# Patient Record
Sex: Female | Born: 1985 | Race: Black or African American | Hispanic: No | Marital: Single | Smoking: Never smoker
Health system: Southern US, Community
[De-identification: ages and names within clinical notes are randomized; demographics above are authoritative.]

## PROBLEM LIST (undated history)

## (undated) DIAGNOSIS — M329 Systemic lupus erythematosus, unspecified: Secondary | ICD-10-CM

## (undated) HISTORY — PX: LAPAROSCOPIC OVARIAN CYSTECTOMY: SUR786

---

## 2014-02-13 ENCOUNTER — Encounter (HOSPITAL_COMMUNITY): Payer: Self-pay | Admitting: Emergency Medicine

## 2014-02-13 ENCOUNTER — Emergency Department (HOSPITAL_COMMUNITY)
Admission: EM | Admit: 2014-02-13 | Discharge: 2014-02-13 | Disposition: A | Discharge status: Home / Self Care | Payer: BC Managed Care – PPO | Attending: Emergency Medicine | Admitting: Emergency Medicine

## 2014-02-13 DIAGNOSIS — Z3202 Encounter for pregnancy test, result negative: Secondary | ICD-10-CM | POA: Diagnosis not present

## 2014-02-13 DIAGNOSIS — R7989 Other specified abnormal findings of blood chemistry: Secondary | ICD-10-CM

## 2014-02-13 DIAGNOSIS — M329 Systemic lupus erythematosus, unspecified: Secondary | ICD-10-CM | POA: Diagnosis not present

## 2014-02-13 DIAGNOSIS — R809 Proteinuria, unspecified: Secondary | ICD-10-CM | POA: Insufficient documentation

## 2014-02-13 DIAGNOSIS — Z79899 Other long term (current) drug therapy: Secondary | ICD-10-CM | POA: Diagnosis not present

## 2014-02-13 HISTORY — DX: Systemic lupus erythematosus, unspecified: M32.9

## 2014-02-13 LAB — URINALYSIS, ROUTINE W REFLEX MICROSCOPIC
Bilirubin Urine: NEGATIVE
GLUCOSE, UA: NEGATIVE mg/dL
KETONES UR: NEGATIVE mg/dL
Leukocytes, UA: NEGATIVE
Nitrite: NEGATIVE
Protein, ur: 100 mg/dL — AB
Specific Gravity, Urine: 1.015 (ref 1.005–1.030)
Urobilinogen, UA: 0.2 mg/dL (ref 0.0–1.0)
pH: 5.5 (ref 5.0–8.0)

## 2014-02-13 LAB — URINE MICROSCOPIC-ADD ON

## 2014-02-13 LAB — CBC WITH DIFFERENTIAL/PLATELET
Basophils Absolute: 0 10*3/uL (ref 0.0–0.1)
Basophils Relative: 0 % (ref 0–1)
EOS PCT: 0 % (ref 0–5)
Eosinophils Absolute: 0 10*3/uL (ref 0.0–0.7)
HEMATOCRIT: 37 % (ref 36.0–46.0)
HEMOGLOBIN: 12.7 g/dL (ref 12.0–15.0)
LYMPHS ABS: 0.8 10*3/uL (ref 0.7–4.0)
LYMPHS PCT: 10 % — AB (ref 12–46)
MCH: 28.7 pg (ref 26.0–34.0)
MCHC: 34.3 g/dL (ref 30.0–36.0)
MCV: 83.7 fL (ref 78.0–100.0)
MONO ABS: 0.3 10*3/uL (ref 0.1–1.0)
MONOS PCT: 3 % (ref 3–12)
NEUTROS ABS: 7.3 10*3/uL (ref 1.7–7.7)
Neutrophils Relative %: 87 % — ABNORMAL HIGH (ref 43–77)
Platelets: 120 10*3/uL — ABNORMAL LOW (ref 150–400)
RBC: 4.42 MIL/uL (ref 3.87–5.11)
RDW: 12.3 % (ref 11.5–15.5)
WBC: 8.4 10*3/uL (ref 4.0–10.5)

## 2014-02-13 LAB — COMPREHENSIVE METABOLIC PANEL
ALT: 15 U/L (ref 0–35)
AST: 18 U/L (ref 0–37)
Albumin: 3.9 g/dL (ref 3.5–5.2)
Alkaline Phosphatase: 72 U/L (ref 39–117)
Anion gap: 15 (ref 5–15)
BILIRUBIN TOTAL: 0.5 mg/dL (ref 0.3–1.2)
BUN: 30 mg/dL — ABNORMAL HIGH (ref 6–23)
CALCIUM: 9.5 mg/dL (ref 8.4–10.5)
CHLORIDE: 104 meq/L (ref 96–112)
CO2: 19 mEq/L (ref 19–32)
CREATININE: 1.48 mg/dL — AB (ref 0.50–1.10)
GFR calc Af Amer: 55 mL/min — ABNORMAL LOW (ref >90)
GFR, EST NON AFRICAN AMERICAN: 47 mL/min — AB (ref >90)
GLUCOSE: 105 mg/dL — AB (ref 70–99)
Potassium: 4.6 mEq/L (ref 3.7–5.3)
Sodium: 138 mEq/L (ref 137–147)
Total Protein: 7.8 g/dL (ref 6.0–8.3)

## 2014-02-13 LAB — PREGNANCY, URINE: Preg Test, Ur: NEGATIVE

## 2014-02-13 MED ORDER — PREDNISONE 10 MG PO TABS: ORAL_TABLET | ORAL | Status: DC

## 2014-02-13 NOTE — ED Provider Notes (Signed)
Medical screening examination/treatment/procedure(s) were conducted as a shared visit with non-physician practitioner(s) and myself.  I personally evaluated the patient during the encounter.   EKG Interpretation None     I reviewed the patient's symptoms and her history present illness with a physical examination. At this point in time she appears to be having a mild exacerbation. She has no cardiopulmonary symptoms nor neurologic symptoms to suggest a complex presentation. At this point time she is well in appearance with stable vital signs and I do agree safe for discharge with a prednisone taper. She will be given resources to find a rheumatologist locally.  Arby Barrette, MD 02/13/14 [phone]

## 2014-02-13 NOTE — ED Notes (Addendum)
Pt has multiple complaints. Pt c/o lupus flare up, c/o pain in hips and knees, States she went jogging the other day and was fine. Pt states she is also fatigue and has scabs in scapl and on other parts of body. States he has been dizziness, constipated and having headaches.

## 2014-02-13 NOTE — ED Provider Notes (Signed)
CSN: [id number]     Arrival date & time 02/13/14  1438 History   First MD Initiated Contact with Patient 02/13/14 1558     Chief Complaint  Patient presents with  . Lupus   HPI  Patient is a 28 y.o. Female with PMH of lupus which is not currently being treated presents with multiple complaints.   Per the patient for the past 4 days she has had achiness and soreness in her bilateral hips, knees, and ankles.  She has also had a discoid rash break out on her face and scalp.  She has had intermittent headaches but is not currently having a headache.  She also feels like she has had some dizziness, weight gain, and constipation.  She denies chest pain shortness of breath, fever, chills, nausea, vomiting, diarrhea, melena, hematochezia.   All other ROS are negative at this time.  Per the patient this feels similar to previous lupus flairs.  The patient states that it has been two years since her last flair.  Patient has recently moved here and does not have a rheumatologist.  Patient has tried taking 20 mg prednisone for the past two days.    Past Medical History  Diagnosis Date  . Lupus    Past Surgical History  Procedure Laterality Date  . Laparoscopic ovarian cystectomy Left    No family history on file. History  Substance Use Topics  . Smoking status: Never Smoker   . Smokeless tobacco: Not on file  . Alcohol Use: Yes   OB History   Grav Para Term Preterm Abortions TAB SAB Ect Mult Living                 Review of Systems See HPI.  All other ROS are negative.   Allergies  Levaquin and Sulfa antibiotics  Home Medications   Prior to Admission medications   Medication Sig Start Date End Date Taking? Authorizing Provider  Multiple Vitamins-Minerals (HM MULTIVITAMIN ADULT GUMMY PO) Take 1 each by mouth daily.   Yes Historical Provider, MD  predniSONE (DELTASONE) 20 MG tablet Take 20 mg by mouth once.   Yes Historical Provider, MD  predniSONE (DELTASONE) 10 MG tablet Day 1 take 6  pills Day 2 take 5 pills Day 3 take 4 pills Day 4 take 3 pills Day 5 take 2 pills Day 6 take 1 pill 02/13/14   Suhani Stillion A Forcucci, PA-C   BP 135/70  Pulse 69  Temp(Src) 98.3 F (36.8 C) (Oral)  Resp 18  SpO2 100%  LMP 02/01/2014 Physical Exam  Nursing note and vitals reviewed. Constitutional: She is oriented to person, place, and time. She appears well-developed and well-nourished. No distress.  HENT:  Head: Normocephalic and atraumatic.  Mouth/Throat: Oropharynx is clear and moist. No oropharyngeal exudate.  Eyes: Conjunctivae and EOM are normal. Pupils are equal, round, and reactive to light. No scleral icterus.  Neck: Normal range of motion. Neck supple. No JVD present. No thyromegaly present.  Cardiovascular: Normal rate, regular rhythm, normal heart sounds and intact distal pulses.  Exam reveals no gallop and no friction rub.   No murmur heard. Pulmonary/Chest: Effort normal and breath sounds normal. No respiratory distress. She has no wheezes. She has no rales. She exhibits no tenderness.  Abdominal: Soft. Bowel sounds are normal. She exhibits no distension and no mass. There is no tenderness. There is no rebound and no guarding.  Musculoskeletal: Normal range of motion.       Right hip: Normal.  Left hip: Normal.       Right knee: Normal.       Left knee: Normal.       Right ankle: Normal.       Left ankle: Normal.  Lymphadenopathy:    She has no cervical adenopathy.  Neurological: She is alert and oriented to person, place, and time. No cranial nerve deficit. Coordination normal.  Skin: Skin is warm and dry. She is not diaphoretic.  Psychiatric: She has a normal mood and affect. Her behavior is normal. Judgment and thought content normal.    ED Course  Procedures (including critical care time) Labs Review Labs Reviewed  CBC WITH DIFFERENTIAL - Abnormal; Notable for the following:    Platelets 120 (*)    Neutrophils Relative % 87 (*)    Lymphocytes Relative  10 (*)    All other components within normal limits  COMPREHENSIVE METABOLIC PANEL - Abnormal; Notable for the following:    Glucose, Bld 105 (*)    BUN 30 (*)    Creatinine, Ser 1.48 (*)    GFR calc non Af Amer 47 (*)    GFR calc Af Amer 55 (*)    All other components within normal limits  URINALYSIS, ROUTINE W REFLEX MICROSCOPIC - Abnormal; Notable for the following:    Hgb urine dipstick TRACE (*)    Protein, ur 100 (*)    All other components within normal limits  PREGNANCY, URINE  URINE MICROSCOPIC-ADD ON    Imaging Review No results found.   EKG Interpretation None      MDM   Final diagnoses:  Exacerbation of systemic lupus  Proteinuria  Elevated serum creatinine   Patient is a 28 y.o. Female who presents to the ED with multiple complaints.  Physical examination is unremarkable.  Orthostatic vitals performed here show no orthostasis.  CBC is unremarkable at this time.  CMP shows mildly elevated SCr and decreased GFR.  UA shows no signs of infection and proteinuria.  Patient endorses a history of lupus neprhitis.  Patient is otherwise stable at this time.  Patient to be given referral to rheumatology and also to nephrology for further evaluation.  Will discharge home with a prednisone taper.  Patient to return to the ED for worsening symptoms of nephritis.  Patient states understanding and agreement at this time.  Patient is stable for discharge.  Patient also seen by Dr. Broadus John who agrees with the above plan.      Eben Burow, PA-C 02/13/14 1821

## 2014-02-13 NOTE — Discharge Instructions (Signed)
Lupus Lupus (also called systemic lupus erythematosus, SLE) is a disorder of the body's natural defense system (immune system). In lupus, the immune system attacks various areas of the body (autoimmune disease). CAUSES The cause is unknown. However, lupus runs in families. Certain genes can make you more likely to develop lupus. It is 10 times more common in women than in men. Lupus is also more common in African Americans and Asians. Other factors also play a role, such as viruses (Epstein-Barr virus, EBV), stress, hormones, cigarette smoke, and certain drugs. SYMPTOMS Lupus can affect many parts of the body, including the joints, skin, kidneys, lungs, heart, nervous system, and blood vessels. The signs and symptoms of lupus differ from person to person. The disease can range from mild to life-threatening. Typical features of lupus include:  Butterfly-shaped rash over the face.  Arthritis involving one or more joints.  Kidney disease.  Fever, weight loss, hair loss, fatigue.  Poor circulation in the fingers and toes (Raynaud's disease).  Chest pain when taking deep breaths. Abdominal pain may also occur.  Skin rash in areas exposed to the sun.  Sores in the mouth and nose. DIAGNOSIS Diagnosing lupus can take a long time and is often difficult. An exam and an accurate account of your symptoms and health problems is very important. Blood tests are necessary, though no single test can confirm or rule out lupus. Most people with lupus test positive for antinuclear antibodies (ANA) on a blood test. Additional blood tests, a urine test (urinalysis), and sometimes a kidney or skin tissue sample (biopsy) can help to confirm or rule out lupus. TREATMENT There is no cure for lupus. Your caregiver will develop a treatment plan based on your age, sex, health, symptoms, and lifestyle. The goals are to prevent flares, to treat them when they do occur, and to minimize organ damage and complications. How  the disease may affect each person varies widely. Most people with lupus can live normal lives, but this disorder must be carefully monitored. Treatment must be adjusted as necessary to prevent serious complications. Medicines used for treatment:  Nonsteroidal anti-inflammatory drugs (NSAIDs) decrease inflammation and can help with chest pain, joint pain, and fevers. Examples include ibuprofen and naproxen.  Antimalarial drugs were designed to treat malaria. They also treat fatigue, joint pain, skin rashes, and inflammation of the lungs in patients with lupus.  Corticosteroids are powerful hormones that rapidly suppress inflammation. The lowest dose with the highest benefit will be chosen. They can be given by cream, pills, injections, and through the vein (intravenously).  Immunosuppressive drugs block the making of immune cells. They may be used for kidney or nerve disease. HOME CARE INSTRUCTIONS  Exercise. Low-impact activities can usually help keep joints flexible without being too strenuous.  Rest after periods of exercise.  Avoid excessive sun exposure.  Follow proper nutrition and take supplements as recommended by your caregiver.  Stress management can be helpful. SEEK MEDICAL CARE IF:  You have increased fatigue.  You develop pain.  You develop a rash.  You have an oral temperature above 102 F (38.9 C).  You develop abdominal discomfort.  You develop a headache.  You experience dizziness. [place] of Neurological Disorders and Stroke: [employer] of Rheumatology: www.rheumatology.[facility] of Arthritis and Musculoskeletal and Skin Diseases: www.niams.SouthExposed.es Document Released: 05/08/2002 Document Revised: 08/10/2011 Document Reviewed: 08/29/2009 [facility] Patient Information 2015 [place], [place]. This information is not intended to replace advice given to you by your health  care provider. Make sure  you discuss any questions you have with your health care provider.   Emergency Department Resource Guide 1) Find a Doctor and Pay Out of Pocket Although you won't have to find out who is covered by your insurance plan, it is a good idea to ask around and get recommendations. You will then need to call the office and see if the doctor you have chosen will accept you as a new patient and what types of options they offer for patients who are self-pay. Some doctors offer discounts or will set up payment plans for their patients who do not have insurance, but you will need to ask so you aren't surprised when you get to your appointment.  2) Contact Your Local Health Department Not all health departments have doctors that can see patients for sick visits, but many do, so it is worth a call to see if yours does. If you don't know where your local health department is, you can check in your phone book. The CDC also has a tool to help you locate your state's health department, and many state websites also have listings of all of their local health departments.  3) Find a Walk-in Clinic If your illness is not likely to be very severe or complicated, you may want to try a walk in clinic. These are popping up all over the country in pharmacies, drugstores, and shopping centers. They're usually staffed by nurse practitioners or physician assistants that have been trained to treat common illnesses and complaints. They're usually fairly quick and inexpensive. However, if you have serious medical issues or chronic medical problems, these are probably not your best option.  No Primary Care Doctor: - Call Health Connect at  [phone] - they can help you locate a primary care doctor that  accepts your insurance, provides certain services, etc. - Physician Referral Service- [phone]  Chronic Pain Problems: Organization         Address  Phone   Notes  [facility]  [phone] Patients need  to be referred by their primary care doctor.   Medication Assistance: Organization         Address  Phone   Notes  [facility] Medication [facility] [address] [place] [place], [place] [address] [phone] --Must be a resident of [facility] -- Must have NO insurance coverage whatsoever (no Medicaid/ Medicare, etc.) -- The pt. MUST have a primary care doctor that directs their care regularly and follows them in the community   [facility]ist  [phone]   [employer]  [phone]    Agencies that provide inexpensive medical care: Organization         Address  Phone   Notes  [facility]  [phone]   [facility]    [phone]   [facility] [address] [place], [place] [address] [phone]   [facility] [address] [place], [place] [phone]   Planned Parenthood    [phone]   [facility]    [phone]   [facility] and [facility]  [address], [place] Phone:  [phone], Fax:  [phone] Hours of Operation:  9 am - 6 pm, M-F.  Also accepts Medicaid/Medicare and self-pay.  [facility]  [address], [place] Phone: [phone], Fax: [phone]. Hours of Operation:  8:30 am - 5:30 pm, M-F.  Also accepts Medicaid and self-pay.  [facility] [place] [address] [place] Phone: [phone]   [facility] [address] Natasha Bence [place], [place] [phone], Ext. [phone] Mondays & Thursdays: 7-9 AM.  First 15 patients are seen on a first come, first serve basis.    Medicaid-accepting [facility] Providers:  Organization         Address  Phone   Notes  [facility] [address], [place] [phone] Also accepts self-pay patients.  [facility] [address] Laurell Josephs [place], [place]  [phone]   [facility] [address], [place] [phone]   [facility] [address] [place] [phone]   Renaye Rakers [address], [place]   [phone] Only accepts [place] Access [place] patients after they have their name applied to their card.   Self-Pay (no insurance) in [facility] 2:  Organization         Address  Phone   Notes  Sickle Cell Patients, [facility] [address] [place], [place] [phone]   [facility] [address] [place], [place] [phone]   [facility]  [address], [place] [phone]   [facility]/Dr. Osei-Bonsu  [address], [place] or [address], [place] [phone] Phone number for both [place] and [place] locations is the same.  Urgent Medical and [facility] [address] [place] [phone]   [facility] [address] [place] or [address] [phone]   [facility] [address] [place] [phone], phone; [phone], fax Sees patients 1st and 3rd Saturday of every month.  Must not qualify for public or private insurance (i.e. Medicaid, Medicare, [place] Health Choice, Veterans' Benefits)  Household income should be no more than 200% of the poverty level The clinic cannot treat you if you are pregnant or think you are pregnant  Sexually transmitted diseases are not treated at the clinic.    Dental Care: Organization         Address  Phone  Notes  [facility] Department of [facility] [address] [place], [place] [phone] Accepts children up to age 38 who are enrolled in [place] or [place] Health Choice; pregnant women with a Medicaid card; and children who have applied for Medicaid or [place] Health Choice, but were declined, whose  parents can pay a reduced fee at time of service.  [facility] Department of [facility]  [address], [place] [phone] Accepts children up to age 8 who are enrolled in [place] or [place] Health Choice; pregnant women with a Medicaid card; and children who have applied for Medicaid or [place] Health Choice, but were declined, whose parents can pay a reduced fee at time of service.  [facility]  [address] [place], [place] [phone] Patients are seen by appointment only. Walk-ins are not accepted. [facility] will see patients 29 years of age and older. Monday - Tuesday (8am-5pm) Most Wednesdays (8:30-5pm) $30 per visit, cash only  [facility] Adult [facility]  [address], [facility] [phone] Patients are seen by appointment  only. Walk-ins are not accepted. [facility] will see patients 87 years of age and older. One Wednesday Evening (Monthly: Volunteer Based).  $30 per visit, cash only  [employer] of [employer]  [phone] for adults; Children under age 13, call Graduate Pediatric Dentistry at [phone]. Children aged 37-14, please call [phone] to request a pediatric application.  Dental services are provided in all areas of dental care including fillings, crowns and bridges, complete and partial dentures, implants, gum treatment, root canals, and extractions. Preventive care is also provided. Treatment is provided to both adults and children. Patients are selected via a lottery and there is often a waiting list.   [facility] [address], [place]  [phone] [email or web address]   [facility] [address] [place], [place] [phone], Ext. [phone] Second and Fourth Thursday of each month, opens at 6:30 AM; Clinic ends at 9 AM.  Patients are seen on a first-come first-served basis, and a limited number are seen during each clinic.   [facility]   [address] Ether Griffins [place], [place] [phone]   Eligibility Requirements You must have lived in [place], [place], or [place] counties for at least the last three months.   You cannot be eligible for state or federal sponsored [place], including [employer], [place], or [employer].   You generally cannot be eligible for healthcare insurance through your employer.    How to apply: Eligibility screenings are held every Tuesday and Wednesday afternoon from 1:00 pm until 4:00 pm. You do not need an appointment for the interview!  [facility] [address], [place], [place] [phone]   [facility] Health Department  [phone]   [facility] Health Department  [phone]   [facility] Health Department  [phone]    Behavioral Health Resources in the Community: Intensive Outpatient Programs Organization         Address  Phone  Notes  [facility] [address] [place], [place] [phone]   [facility] Outpatient [address], [place], [place] [phone]   [facility] [address], [place], [place]  [phone]   [facility] Mental Health [address],  [place], [place] [phone] or [phone]   Substance Abuse Resources Organization         Address  Phone  Notes  [facility] Drug [facility]  [phone]   [facility]  [phone]   [facility] [place]  [phone]   Floydene Flock  [phone]   Residential & Outpatient Substance Abuse Program  [phone]   Psychological Services Organization         Address  Phone  Notes  [facility] Behavioral Health  [phone]   [facility]  [phone]   [facility] [address] [place] [phone] or [phone]    Mobile Crisis Teams Organization         Address  Phone  Notes  [facility], Mobile Crisis Care Unit  [phone]     [facility]  [address] [place], [place] [phone]   Doristine Locks [address] [place] [phone]    Self-Help/Support Groups Organization         Address  Phone             Notes  [facility] [place] - variety of support groups  336- I7437963 Call for more information  Narcotics Anonymous (NA), [facility] [address], [employer] [place]  2 meetings at this location   Special educational needs teacher         Address  Phone  Notes  [facility] [place],    [place]  [phone]   [facility]  [address] [place] [address], [place], [place]   [place], [place] [phone] Admissions: 8am-3pm M-F  Incentives Substance [place] [address]    [place], [place] [phone]   [facility] [address] [place], [place], [place]   [facility] [address],  [place], [place]   [facility] - Intensive Outpatient [address] Kristeen Mans [address], [place], [place]   [facility] ([address] [place].,  [place], [place] [phone] or [phone]   [facility] ([facility]) [address] [place], [place] Accepts Medicaid  [facility] [address],  [place] [phone] Substance Abuse/Addiction Treatment   [facility] Organization         Address  Phone  Notes  [facility]  [phone]   Domenic Schwab, PhD [address] Arlis Porta [place], [place]   [phone] or [phone]   [facility], [place] [phone]   [facility] [address], [place], [place] [phone] Insurance/Medicaid/sponsorship through [facility] and Families [address] [place]                                     [place], [place] [phone] [place] [address], [place] [phone]    Dr. Adele Schilder  [phone]   [facility] Dept. 1) [address], [place] 2) [place] 3)  [address], [place] [phone]  [phone]   [place] [phone] or [phone] (After Hours)

## 2014-02-13 NOTE — Progress Notes (Signed)
  CARE MANAGEMENT ED NOTE 02/13/2014  Patient:  Robin Mayer, Robin Mayer   Account Number:  [id number]  Date Initiated:  02/13/2014  Documentation initiated by:  Edd Arbour  Subjective/Objective Assessment:   28 yr old bcbs [place] c/o lupus flare up, c/o pain in hips and knees, confirms with CM she does not have a pcp at this time     Subjective/Objective Assessment Detail:   Pt states she lives in [place] but works in [place] and recently moved from [employer] Pt very pleasant     Action/Plan:   ED CM spoke with pt Discussed importance of f/u care with a pcp see note below Answered questions about in network, EDPs and out of network providers   Action/Plan Detail:   Anticipated DC Date:       Status Recommendation to Physician:   Result of Recommendation:    Other ED Services  Consult Working Psychologist, educational  Other  PCP issues  Outpatient Services - Pt will follow up    Choice offered to / List presented to:            Status of service:  Completed, signed off  ED Comments:   ED Comments Detail:  [facility] ED CM spoke with pt on how to obtain an in network pcp with insurance coverage via the customer service number or web site Cm reviewed ED level of care for crisis/emergent services and community pcp level of care to manage continuous or chronic medical concerns.  The pt voiced understanding CM encouraged pt and discussed pt's responsibility to verify with pt's insurance carrier that any recommended medical provider offered by any emergency room or a hospital provider is within the carrier's network. The pt voiced understanding

## 2014-02-14 NOTE — ED Provider Notes (Signed)
Medical screening examination/treatment/procedure(s) were conducted as a shared visit with non-physician practitioner(s) and myself.  I personally evaluated the patient during the encounter.   EKG Interpretation None       Arby Barrette, MD 02/14/14 0004

## 2014-02-26 ENCOUNTER — Ambulatory Visit: Payer: BC Managed Care – PPO | Admitting: Family Medicine

## 2014-04-02 ENCOUNTER — Inpatient Hospital Stay (HOSPITAL_COMMUNITY)
Admission: EM | Admit: 2014-04-02 | Discharge: 2014-04-05 | DRG: 546 | Disposition: A | Discharge status: Home / Self Care | Payer: BC Managed Care – PPO | Attending: Internal Medicine | Admitting: Internal Medicine

## 2014-04-02 ENCOUNTER — Inpatient Hospital Stay (HOSPITAL_COMMUNITY): Payer: BC Managed Care – PPO

## 2014-04-02 ENCOUNTER — Encounter (HOSPITAL_COMMUNITY): Payer: Self-pay | Admitting: Emergency Medicine

## 2014-04-02 DIAGNOSIS — N189 Chronic kidney disease, unspecified: Secondary | ICD-10-CM | POA: Diagnosis present

## 2014-04-02 DIAGNOSIS — M3214 Glomerular disease in systemic lupus erythematosus: Principal | ICD-10-CM | POA: Diagnosis present

## 2014-04-02 DIAGNOSIS — IMO0002 Reserved for concepts with insufficient information to code with codable children: Secondary | ICD-10-CM | POA: Insufficient documentation

## 2014-04-02 DIAGNOSIS — M329 Systemic lupus erythematosus, unspecified: Secondary | ICD-10-CM | POA: Insufficient documentation

## 2014-04-02 DIAGNOSIS — I129 Hypertensive chronic kidney disease with stage 1 through stage 4 chronic kidney disease, or unspecified chronic kidney disease: Secondary | ICD-10-CM | POA: Diagnosis present

## 2014-04-02 DIAGNOSIS — R609 Edema, unspecified: Secondary | ICD-10-CM | POA: Diagnosis present

## 2014-04-02 DIAGNOSIS — Z79899 Other long term (current) drug therapy: Secondary | ICD-10-CM

## 2014-04-02 DIAGNOSIS — E875 Hyperkalemia: Secondary | ICD-10-CM | POA: Diagnosis present

## 2014-04-02 DIAGNOSIS — N179 Acute kidney failure, unspecified: Secondary | ICD-10-CM | POA: Diagnosis present

## 2014-04-02 DIAGNOSIS — E872 Acidosis, unspecified: Secondary | ICD-10-CM | POA: Diagnosis present

## 2014-04-02 DIAGNOSIS — R0789 Other chest pain: Secondary | ICD-10-CM | POA: Diagnosis present

## 2014-04-02 DIAGNOSIS — N19 Unspecified kidney failure: Secondary | ICD-10-CM

## 2014-04-02 DIAGNOSIS — R079 Chest pain, unspecified: Secondary | ICD-10-CM

## 2014-04-02 LAB — URINALYSIS, ROUTINE W REFLEX MICROSCOPIC
Bilirubin Urine: NEGATIVE
Glucose, UA: NEGATIVE mg/dL
KETONES UR: NEGATIVE mg/dL
Leukocytes, UA: NEGATIVE
Nitrite: NEGATIVE
Protein, ur: >300 mg/dL — AB
SPECIFIC GRAVITY, URINE: 1.017 (ref 1.005–1.030)
UROBILINOGEN UA: 0.2 mg/dL (ref 0.0–1.0)
pH: 5 (ref 5.0–8.0)

## 2014-04-02 LAB — COMPREHENSIVE METABOLIC PANEL
ALBUMIN: 3.6 g/dL (ref 3.5–5.2)
ALK PHOS: 56 U/L (ref 39–117)
ALT: 17 U/L (ref 0–35)
ANION GAP: 15 (ref 5–15)
AST: 15 U/L (ref 0–37)
BILIRUBIN TOTAL: 0.4 mg/dL (ref 0.3–1.2)
BUN: 72 mg/dL — AB (ref 6–23)
CHLORIDE: 106 meq/L (ref 96–112)
CO2: 15 mEq/L — ABNORMAL LOW (ref 19–32)
Calcium: 8.7 mg/dL (ref 8.4–10.5)
Creatinine, Ser: 3.02 mg/dL — ABNORMAL HIGH (ref 0.50–1.10)
GFR, EST AFRICAN AMERICAN: 23 mL/min — AB (ref >90)
GFR, EST NON AFRICAN AMERICAN: 20 mL/min — AB (ref >90)
GLUCOSE: 111 mg/dL — AB (ref 70–99)
Potassium: 4.9 mEq/L (ref 3.7–5.3)
Sodium: 136 mEq/L — ABNORMAL LOW (ref 137–147)
Total Protein: 7.2 g/dL (ref 6.0–8.3)

## 2014-04-02 LAB — CBC WITH DIFFERENTIAL/PLATELET
Basophils Absolute: 0 10*3/uL (ref 0.0–0.1)
Basophils Relative: 0 % (ref 0–1)
Eosinophils Absolute: 0 10*3/uL (ref 0.0–0.7)
Eosinophils Relative: 0 % (ref 0–5)
HEMATOCRIT: 31.5 % — AB (ref 36.0–46.0)
Hemoglobin: 11 g/dL — ABNORMAL LOW (ref 12.0–15.0)
LYMPHS ABS: 0.8 10*3/uL (ref 0.7–4.0)
LYMPHS PCT: 7 % — AB (ref 12–46)
MCH: 28.7 pg (ref 26.0–34.0)
MCHC: 34.9 g/dL (ref 30.0–36.0)
MCV: 82.2 fL (ref 78.0–100.0)
MONO ABS: 0.4 10*3/uL (ref 0.1–1.0)
MONOS PCT: 4 % (ref 3–12)
NEUTROS PCT: 89 % — AB (ref 43–77)
Neutro Abs: 9.5 10*3/uL — ABNORMAL HIGH (ref 1.7–7.7)
Platelets: 151 10*3/uL (ref 150–400)
RBC: 3.83 MIL/uL — AB (ref 3.87–5.11)
RDW: 12.9 % (ref 11.5–15.5)
WBC: 10.7 10*3/uL — ABNORMAL HIGH (ref 4.0–10.5)

## 2014-04-02 LAB — LIPASE, BLOOD: Lipase: 40 U/L (ref 11–59)

## 2014-04-02 LAB — TROPONIN I
TROPONIN I: 0.36 ng/mL — AB (ref <0.30)
Troponin I: 0.39 ng/mL (ref <0.30)

## 2014-04-02 LAB — URINE MICROSCOPIC-ADD ON

## 2014-04-02 LAB — RAPID URINE DRUG SCREEN, HOSP PERFORMED
AMPHETAMINES: NOT DETECTED
Barbiturates: NOT DETECTED
Benzodiazepines: NOT DETECTED
Cocaine: NOT DETECTED
OPIATES: NOT DETECTED
Tetrahydrocannabinol: NOT DETECTED

## 2014-04-02 MED ORDER — SODIUM CHLORIDE 0.9 % IV SOLN
INTRAVENOUS | Status: DC
  Administered 2014-04-02: 18:00:00 via INTRAVENOUS

## 2014-04-02 MED ORDER — ONDANSETRON HCL 4 MG/2ML IJ SOLN: 4.0000 mg | Freq: Four times a day (QID) | INTRAMUSCULAR | Status: DC | PRN

## 2014-04-02 MED ORDER — HEPARIN SODIUM (PORCINE) 5000 UNIT/ML IJ SOLN
5000.0000 [IU] | Freq: Three times a day (TID) | INTRAMUSCULAR | Status: AC
  Administered 2014-04-02: 5000 [IU] via SUBCUTANEOUS
  Filled 2014-04-02: qty 1

## 2014-04-02 MED ORDER — HEPARIN SODIUM (PORCINE) 5000 UNIT/ML IJ SOLN
5000.0000 [IU] | Freq: Three times a day (TID) | INTRAMUSCULAR | Status: DC
  Filled 2014-04-02: qty 1

## 2014-04-02 MED ORDER — SODIUM CHLORIDE 0.9 % IV SOLN
250.0000 mg | Freq: Two times a day (BID) | INTRAVENOUS | Status: AC
  Administered 2014-04-02 – 2014-04-05 (×6): 250 mg via INTRAVENOUS
  Filled 2014-04-02 (×7): qty 2

## 2014-04-02 MED ORDER — ACETAMINOPHEN 325 MG PO TABS: 650.0000 mg | ORAL_TABLET | Freq: Four times a day (QID) | ORAL | Status: DC | PRN

## 2014-04-02 MED ORDER — HYDROCODONE-ACETAMINOPHEN 5-325 MG PO TABS
1.0000 | ORAL_TABLET | Freq: Once | ORAL | Status: AC
  Administered 2014-04-02: 1 via ORAL
  Filled 2014-04-02: qty 1

## 2014-04-02 MED ORDER — SODIUM CHLORIDE 0.9 % IJ SOLN
3.0000 mL | Freq: Two times a day (BID) | INTRAMUSCULAR | Status: DC
  Administered 2014-04-03 – 2014-04-05 (×4): 3 mL via INTRAVENOUS

## 2014-04-02 MED ORDER — DEXTROSE 5 % IV SOLN
INTRAVENOUS | Status: DC
  Administered 2014-04-02: 19:00:00 via INTRAVENOUS

## 2014-04-02 MED ORDER — SODIUM CHLORIDE 0.9 % IV BOLUS (SEPSIS)
1000.0000 mL | Freq: Once | INTRAVENOUS | Status: AC
  Administered 2014-04-02: 1000 mL via INTRAVENOUS

## 2014-04-02 MED ORDER — ACETAMINOPHEN 650 MG RE SUPP: 650.0000 mg | Freq: Four times a day (QID) | RECTAL | Status: DC | PRN

## 2014-04-02 MED ORDER — MYCOPHENOLATE MOFETIL 250 MG PO CAPS
1000.0000 mg | ORAL_CAPSULE | Freq: Two times a day (BID) | ORAL | Status: DC
  Administered 2014-04-02 – 2014-04-05 (×6): 1000 mg via ORAL
  Filled 2014-04-02 (×6): qty 4

## 2014-04-02 MED ORDER — ONDANSETRON HCL 4 MG PO TABS: 4.0000 mg | ORAL_TABLET | Freq: Four times a day (QID) | ORAL | Status: DC | PRN

## 2014-04-02 NOTE — Plan of Care (Signed)
Problem: Phase I Progression Outcomes Goal: Pain controlled with appropriate interventions Outcome: Completed/Met Date Met:  04/02/14 Goal: OOB as tolerated unless otherwise ordered Outcome: Completed/Met Date Met:  04/02/14 Goal: Initial discharge plan identified Outcome: Completed/Met Date Met:  04/02/14 Goal: Voiding-avoid urinary catheter unless indicated Outcome: Completed/Met Date Met:  04/02/14 Goal: Hemodynamically stable Outcome: Completed/Met Date Met:  04/02/14 Goal: Other Phase I Outcomes/Goals Outcome: Completed/Met Date Met:  04/02/14

## 2014-04-02 NOTE — ED Provider Notes (Signed)
CSN: [id number]     Arrival date & time 04/02/14  1230 History   First MD Initiated Contact with Patient 04/02/14 1246     Chief Complaint  Patient presents with  . headahce   . Back Pain  . Joint Swelling  . 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 Patient states that over the last week she has had worsening ankle swelling, headache, and back pain. She also reports joint pain. She states that her symptoms are consistent with a lupus flare.  She thinks she may be having a reaction to Plaquenil. She saw her primary physician on Friday and was placed back on Plaquenil for lupus.  She states that since that time her symptoms have worsened. She denies any rash, shortness of breath, difficulty breathing. Patient reports that she has a follow-up appointment on the fourth but "can't wait that long." She also has follow-up with nephrology and rheumatology but "they don't how serious my case and I'm having to wait." She reports that she has a nephrology appointment on November 18.   Past Medical History  Diagnosis Date  . Lupus    Past Surgical History  Procedure Laterality Date  . Laparoscopic ovarian cystectomy Left    History reviewed. No pertinent family history. History  Substance Use Topics  . Smoking status: Never Smoker   . Smokeless tobacco: Never Used  . Alcohol Use: Yes   OB History    No data available     Review of Systems  Constitutional: Negative for fever and chills.  Respiratory: Negative for cough, chest tightness and shortness of breath.   Cardiovascular: Negative for chest pain.  Gastrointestinal: Positive for abdominal pain. Negative for nausea, vomiting, diarrhea and constipation.  Genitourinary: Negative for dysuria.  Musculoskeletal: Positive for back pain.   Ankle swelling  Skin: Negative for rash.  Neurological: Positive for dizziness, light-headedness and headaches. Negative for weakness and numbness.  Psychiatric/Behavioral: Negative for confusion.  All other systems reviewed and are negative.     Allergies  Levaquin; Novocain; and Sulfa antibiotics  Home Medications   Prior to Admission medications   Medication Sig Start Date End Date Taking? Authorizing Provider  albuterol (PROVENTIL HFA;VENTOLIN HFA) 108 (90 BASE) MCG/ACT inhaler Inhale 2 puffs into the lungs every 6 (six) hours as needed for wheezing or shortness of breath (wheezing & shortness of breath).   Yes Historical Provider, MD  Multiple Vitamins-Minerals (HM MULTIVITAMIN ADULT GUMMY PO) Take 1 each by mouth daily.   Yes Historical Provider, MD  predniSONE (DELTASONE) 10 MG tablet Day 1 take 6 pills Day 2 take 5 pills Day 3 take 4 pills Day 4 take 3 pills Day 5 take 2 pills Day 6 take 1 pill 02/13/14  Yes Demica Zook A Forcucci, PA-C  predniSONE (DELTASONE) 20 MG tablet Take 20 mg by mouth once.    Historical Provider, MD   BP 144/99 mmHg  Pulse 78  Temp(Src) 98.2 F (36.8 C) (Oral)  Resp 16  SpO2 100%  LMP 03/29/2014 Physical Exam  Constitutional: She is oriented to person, place, and time. She appears well-developed and well-nourished. No distress.  HENT:  Head: Normocephalic and atraumatic.  Eyes: EOM are normal. Pupils are equal, round, and reactive to light.  Neck: Neck supple.  Cardiovascular: Normal rate and regular rhythm.   Murmur heard. Pulmonary/Chest: Effort normal and breath sounds  normal. No respiratory distress. She has no wheezes.  Abdominal: Soft. Bowel sounds are normal. There is no tenderness. There is no rebound and no guarding.  Musculoskeletal: She exhibits edema.  1+ bilateral lower extremity edema  Neurological: She is alert and oriented to person, place, and time.  Skin: Skin is warm and dry. No rash noted.  Psychiatric: She has a normal  mood and affect.  Nursing note and vitals reviewed.   ED Course  Procedures (including critical care time) Labs Review Labs Reviewed  CBC WITH DIFFERENTIAL - Abnormal; Notable for the following:    WBC 10.7 (*)    RBC 3.83 (*)    Hemoglobin 11.0 (*)    HCT 31.5 (*)    Neutrophils Relative % 89 (*)    Neutro Abs 9.5 (*)    Lymphocytes Relative 7 (*)    All other components within normal limits  COMPREHENSIVE METABOLIC PANEL - Abnormal; Notable for the following:    Sodium 136 (*)    CO2 15 (*)    Glucose, Bld 111 (*)    BUN 72 (*)    Creatinine, Ser 3.02 (*)    GFR calc non Af Amer 20 (*)    GFR calc Af Amer 23 (*)    All other components within normal limits  URINALYSIS, ROUTINE W REFLEX MICROSCOPIC - Abnormal; Notable for the following:    APPearance CLOUDY (*)    Hgb urine dipstick LARGE (*)    Protein, ur >300 (*)    All other components within normal limits  URINE MICROSCOPIC-ADD ON - Abnormal; Notable for the following:    Squamous Epithelial / LPF FEW (*)    Bacteria, UA FEW (*)    Casts HYALINE CASTS (*)    All other components within normal limits  LIPASE, BLOOD  SODIUM, URINE, RANDOM  CREATININE, URINE, RANDOM    Imaging Review No results found.   EKG Interpretation None      MDM   Final diagnoses:  Acute on chronic kidney failure  Lupus  AKI (acute kidney injury)    Patient with a history of lupus who presents with swelling as well as multiple other complaints. Is nontoxic on exam. Has 1+ bilateral lower extremity edema. Recently started Plaquenil. Lab work notable kidney injury with a creatinine of 3.  Patient also noted to have proteinuria and hematuria.  She does not have a nephrologist at this time. Will admit for further workup given acute on chronic renal failure. Have added urine electrolytes as well as renal ultrasound.    Shon Batonourtney F Shirrell Solinger, MD 04/02/14 [phone]

## 2014-04-02 NOTE — ED Notes (Signed)
Pt has lupus and [place] and having on waiting list to get in with specialty doctors. Pt c/o body swelling esp in ankles, back pain, abd pain, headaches, headaches that have been going on since last week.

## 2014-04-02 NOTE — Progress Notes (Signed)
Utilization Review completed.  Geet Hosking RN CM  

## 2014-04-02 NOTE — Progress Notes (Addendum)
Called by RN about elevated troponin of 0.39.   Pt presently stable.  EKG ordered.  I went to evaluate pt.  Pt denies any CP or SOB presently.  EKG--sinus without any ST-T changes.  VSS.  Minimally elevated troponin of no clinical significance.  Likely due to the patient's acute on chronic renal failure.  Continue to monitor pt clinically.  DTat

## 2014-04-02 NOTE — Consult Note (Signed)
Renal Service Consult Note [facility]  Robin CollardLeslie Streeter 04/02/2014 Robin Mayer D Requesting Physician: Dr Tat  Reason for Consult:  SLE patient w ^'d creatinine HPI: The patient is a 28 y.o. year-old with hx of SLE dx'd in 2005.  Initial presentation according to patient was with systemic symptoms, fevers, rashes, joint pain. She developed kidney involvement with protein and slightly elevated creat that same year.  She was treated for several years (no kidney bx was done) with plaquenil and Cellcept, with intermittent prednisone for flares.  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    She did well and renal function was stable according to her, such that in March 2014 her immunosuppression was stopped.  She has been followed and renal function has been stable (in [place]). Moved to this area and has been feeling poorly for 1-2 months primarily with swelling of the face, legs , arms, periodic rashes and some fatigue.  She came to ED here in Sept and creat from that visit was 1.48.   She saw a new PCP here about a week ago who started her on a prednisone taper from 60 mg and she is now on about 20 mg.    Came to ED today and lab work showing creat 3.0, BUN 70.  Pt admitted for renal issues, possible lupus nephritis.    Patient denies use of NSAID's, occ Tylenol use. No dysuria, hematuria or voiding complaints.  BP has been "high" the last 1-2 months, she says never was like that before.  No CP, + cough occasional, occ HA, no blurred vision. No skin lesions or mouth sores.  No specific joint pains.    Past Medical History  Past Medical History  Diagnosis Date  . Lupus    Past Surgical History  Past Surgical History  Procedure Laterality Date  . Laparoscopic ovarian cystectomy Left    Family History History reviewed. No pertinent family history. Social History  reports that she has never smoked. She has never used smokeless tobacco. She reports that she drinks  alcohol. Her drug history is not on file. Allergies  Allergies  Allergen Reactions  . Levaquin [Levofloxacin In D5w] Anaphylaxis  . Novocain [Procaine] Anaphylaxis  . Sulfa Antibiotics Anaphylaxis   Home medications Prior to Admission medications   Medication Sig Start Date End Date Taking? Authorizing Provider  albuterol (PROVENTIL HFA;VENTOLIN HFA) 108 (90 BASE) MCG/ACT inhaler Inhale 2 puffs into the lungs every 6 (six) hours as needed for wheezing or shortness of breath (wheezing & shortness of breath).   Yes Historical Provider, MD  Multiple Vitamins-Minerals (HM MULTIVITAMIN ADULT GUMMY PO) Take 1 each by mouth daily.   Yes Historical Provider, MD  predniSONE (DELTASONE) 10 MG tablet Day 1 take 6 pills Day 2 take 5 pills Day 3 take 4 pills Day 4 take 3 pills Day 5 take 2 pills Day 6 take 1 pill 02/13/14  Yes Courtney A Forcucci, PA-C  predniSONE (DELTASONE) 20 MG tablet Take 20 mg by mouth once.    Historical Provider, MD   Liver Function Tests  Recent Labs Lab 04/02/14 1252  AST 15  ALT 17  ALKPHOS 56  BILITOT 0.4  PROT 7.2  ALBUMIN 3.6    Recent Labs Lab 04/02/14 1252  LIPASE 40   CBC  Recent Labs Lab 04/02/14 1252  WBC 10.7*  NEUTROABS 9.5*  HGB 11.0*  HCT 31.5*  MCV 82.2  PLT 151   Basic Metabolic Panel  Recent Labs Lab 04/02/14  1252  NA 136*  K 4.9  CL 106  CO2 15*  GLUCOSE 111*  BUN 72*  CREATININE 3.02*  CALCIUM 8.7    Filed Vitals:   04/02/14 1233 04/02/14 1607 04/02/14 1613 04/02/14 1630  BP: 157/88 144/99  136/87  Pulse: 80 78  71  Temp: 97.7 F (36.5 C)  98.2 F (36.8 C) 98.3 F (36.8 C)  TempSrc: Oral  Oral Oral  Resp: 16  16 18   Height:    5\' 3"  (1.6 m)  Weight:    90.7 kg (199 lb 15.3 oz)  SpO2: 100% 100%  100%   Exam Alert young adult female, not in distres No rash, cyanosis or gangrene Sclera anicteric, throat clear Face is slightly swollen No jvd Chest some coarse BS R base, L side clear RRR tachy 1/6 SEM  LUSB, no gallop Abd soft, NTND, no HSM or ascites No joint effusion of hands, feets Mild-mod pretib edema bilat LE's Neuro is nf, Ox 3  UA (undersigned ) > 4+ blood , 3+ protein, active sediment (mild) with dysmorphic rbc's (5-10 /hpf), WBC's 2-3 / hpf and rare cellular casts Na 136, K 4.9, BUN 72, Creat 3.02, CO2 15, alb 3.6, Ca 8.7, LFT's wnl WBC 10k   Hb 11.0  plts 151  Assessment: 1 Acute on CKD- in SLE patient with proteinuria and microhematuria, active sediment. Creat up to 3.0, baseline is 1.2 - 1.3 according to labwork she brought with from [place].  Primary concern is lupus nephritis. Has never been biopsied but was on Cellcept for many years and tolerated well. With new worsening renal failure and active sediment will treat aggressively with IV steroids, po Cellcept and plan on kidney biopsy.  Have d/w patient who seems to agree to biopsy at this time although she is having some difficulty with the idea. Have d/w pt's mother as well.   2 Metabolic acidosis   Plan- high dose IV solumedrol 500 mg per day for 3 days, renal biopsy tomorrow or Wednesday, start po Cellcept at 1 gm bid and work up to 1500 mg bid if will tolerate.  Will follow.    Vinson Moselleob Tiearra Colwell MD (pgr) [phone]    ([phone]) [phone] 04/02/2014, 5:25 PM

## 2014-04-02 NOTE — H&P (Signed)
History and Physical  Robin CollardLeslie Mayer ZOX:[id number] DOB: 10/30/1985 DOA: 04/02/2014   PCP: Verlon AuBoyd, Tammy Lamonica, MD   Chief Complaint: chest pain, ankle edema, headache  HPI:  28 year old female with a history of lupus nephritis presents with 3 day history of worsening ankle edema, headache, and chest discomfort. The patient states that she has had the above symptoms for many weeks, but they had worsened on 03/30/2014. The patient went to see her primary care provider at that time and was placed on Plaquenil and a prednisone taper starting at 60 mg. The patient started Plaquenil and prednisone on 03/31/2014.  She stated that all her symptoms had worsened since starting the medications which prompted her to come to the emergency department today. She has had subjective fevers and chills without any coughing, hemoptysis, vomiting, diarrhea, hematochezia, melena. She has not had any rashes or synovitis. The patient also complains of epigastric and  Left upper quadrant abdominal discomfort started 03/30/2014. She denies any use of NSAIDs or other supplements. Regarding her lupus, the patient was diagnosed in 2005 when she had been previously on CellCept and Plaquenil up until approximately one year ago. She has not been on any immunomodulatory drugs since that time until she was restarted on Plaquenil and prednisone on Friday, 03/30/2014. The patient also has been having intermittent chest discomfort at rest as well as with activity. She denies any shortness of breath or hemoptysis.she states that her chest discomfort has been on and off for a number of weeks without any dizziness, diaphoresis, or syncope. The patient denies any NSAID use  In the emergency department, the patient was found to have potassium 4.9, bicarbonate 15, serum creatinine 3.0 hepatic enzymes and lipase were unremarkable. WBC was 10.7. Hemoglobin 11.0. Platelets 151,000.  She was afebrile and hemodynamically stable without any  tachycardia. Oxygen saturation was 100% on room air. Assessment/Plan: Acute on chronic renal failure -Baseline creatinine 1.2-1.3 back in 2012 -Renal ultrasound -Urinalysis shows proteinuria and microscopic hematuria -I have consulted nephrology--spoke with Dr. Arta SilenceShertz -IV fluids Atypical chest discomfort -Cycle troponins -EKG -Urine drug screen -Chest x-ray -echocardiogram Lupus with lupus nephritis -Check ANA, anti-dsDNA, anti-Sm, C3, C4, CH50 to assess the patient's disease activity Lower extremity pain and edema -Venous duplex -likely related to patient's proteinuria Back pain/abdominal pain -Suspect musculoskeletal -On examination there are no rashes and abdominal exam is completely benign -Conservative therapy for now       Past Medical History  Diagnosis Date  . Lupus    Past Surgical History  Procedure Laterality Date  . Laparoscopic ovarian cystectomy Left    Social History:  reports that she has never smoked. She has never used smokeless tobacco. She reports that she drinks alcohol. Her drug history is not on file.   History reviewed. No pertinent family history.   Allergies  Allergen Reactions  . Levaquin [Levofloxacin In D5w] Anaphylaxis  . Novocain [Procaine] Anaphylaxis  . Sulfa Antibiotics Anaphylaxis      Prior to Admission medications   Medication Sig Start Date End Date Taking? Authorizing Provider  albuterol (PROVENTIL HFA;VENTOLIN HFA) 108 (90 BASE) MCG/ACT inhaler Inhale 2 puffs into the lungs every 6 (six) hours as needed for wheezing or shortness of breath (wheezing & shortness of breath).   Yes Historical Provider, MD  Multiple Vitamins-Minerals (HM MULTIVITAMIN ADULT GUMMY PO) Take 1 each by mouth daily.   Yes Historical Provider, MD  predniSONE (DELTASONE) 10 MG tablet Day 1 take 6 pills Day 2 take  5 pills Day 3 take 4 pills Day 4 take 3 pills Day 5 take 2 pills Day 6 take 1 pill 02/13/14  Yes Courtney A Forcucci, PA-C  predniSONE  (DELTASONE) 20 MG tablet Take 20 mg by mouth once.    Historical Provider, MD    Review of Systems:  Constitutional:  No weight loss, night sweats, Fevers Head&Eyes: No headache.  No vision loss.  No eye pain or scotoma ENT:  No Difficulty swallowing,Tooth/dental problems,Sore throat,   Cardio-vascular:  No  Orthopnea, PND, swelling in lower extremities,  dizziness, palpitations  GI:  No  vomiting, diarrhea, loss of appetite, hematochezia, melena, heartburn, indigestion, Resp:   No cough. No coughing up of blood .No wheezing.No chest wall deformity  Skin:  no rash or lesions.  GU:  no dysuria, change in color of urine, no urgency or frequency.  Musculoskeletal:  No joint pain or swelling. No decreased range of motion.  Psych:  No change in mood or affect.  Neurologic: No headache, no dysesthesia, no focal weakness, no vision loss. No syncope  Physical Exam: Filed Vitals:   04/02/14 1233  BP: 157/88  Pulse: 80  Temp: 97.7 F (36.5 C)  TempSrc: Oral  Resp: 16  SpO2: 100%   General:  A&O x 3, NAD, nontoxic, pleasant/cooperative Head/Eye: No conjunctival hemorrhage, no icterus, [place]/AT, No nystagmus ENT:  No icterus,  No thrush, good dentition, no pharyngeal exudate Neck:  No masses, no lymphadenpathy, no bruits; shotty cervical LN CV:  RRR, no rub, no gallop, no S3 Lung:  CTAB, good air movement, no wheeze, no rhonchi Abdomen: soft/NT, +BS, nondistended, no peritoneal signs; no costovertebral tenderness Ext: No cyanosis, No rashes, No petechiae, No lymphangitis, No edema Neuro: CNII-XII intact, strength 4/5 in bilateral upper and lower extremities, no dysmetria  Labs on Admission:  Basic Metabolic Panel:  Recent Labs Lab 04/02/14 1252  NA 136*  K 4.9  CL 106  CO2 15*  GLUCOSE 111*  BUN 72*  CREATININE 3.02*  CALCIUM 8.7   Liver Function Tests:  Recent Labs Lab 04/02/14 1252  AST 15  ALT 17  ALKPHOS 56  BILITOT 0.4  PROT 7.2  ALBUMIN 3.6     Recent Labs Lab 04/02/14 1252  LIPASE 40   No results for input(s): AMMONIA in the last 168 hours. CBC:  Recent Labs Lab 04/02/14 1252  WBC 10.7*  NEUTROABS 9.5*  HGB 11.0*  HCT 31.5*  MCV 82.2  PLT 151   Cardiac Enzymes: No results for input(s): CKTOTAL, CKMB, CKMBINDEX, TROPONINI in the last 168 hours. BNP: Invalid input(s): POCBNP CBG: No results for input(s): GLUCAP in the last 168 hours.  Radiological Exams on Admission: No results found.  EKG: Independently reviewed. pending    Time spent:70 minutes Code Status:   FULL Family Communication:   No Family at bedside   Adeel Guiffre, DO  [facility] Pager [phone]  If 7PM-7AM, please contact night-coverage [email or web address] Password [email or web address] 04/02/2014, 3:32 PM

## 2014-04-03 DIAGNOSIS — M79609 Pain in unspecified limb: Secondary | ICD-10-CM

## 2014-04-03 DIAGNOSIS — I059 Rheumatic mitral valve disease, unspecified: Secondary | ICD-10-CM

## 2014-04-03 DIAGNOSIS — R079 Chest pain, unspecified: Secondary | ICD-10-CM

## 2014-04-03 DIAGNOSIS — E875 Hyperkalemia: Secondary | ICD-10-CM | POA: Diagnosis not present

## 2014-04-03 DIAGNOSIS — R600 Localized edema: Secondary | ICD-10-CM

## 2014-04-03 LAB — BASIC METABOLIC PANEL
Anion gap: 11 (ref 5–15)
BUN: 71 mg/dL — AB (ref 6–23)
CHLORIDE: 109 meq/L (ref 96–112)
CO2: 16 meq/L — AB (ref 19–32)
Calcium: 8.2 mg/dL — ABNORMAL LOW (ref 8.4–10.5)
Creatinine, Ser: 3.14 mg/dL — ABNORMAL HIGH (ref 0.50–1.10)
GFR calc Af Amer: 22 mL/min — ABNORMAL LOW (ref >90)
GFR calc non Af Amer: 19 mL/min — ABNORMAL LOW (ref >90)
GLUCOSE: 138 mg/dL — AB (ref 70–99)
POTASSIUM: 6.6 meq/L — AB (ref 3.7–5.3)
Sodium: 136 mEq/L — ABNORMAL LOW (ref 137–147)

## 2014-04-03 LAB — ANTI-NUCLEAR AB-TITER (ANA TITER)

## 2014-04-03 LAB — APTT: aPTT: 29 seconds (ref 24–37)

## 2014-04-03 LAB — CBC
HEMATOCRIT: 31 % — AB (ref 36.0–46.0)
HEMOGLOBIN: 10.9 g/dL — AB (ref 12.0–15.0)
MCH: 29.1 pg (ref 26.0–34.0)
MCHC: 35.2 g/dL (ref 30.0–36.0)
MCV: 82.7 fL (ref 78.0–100.0)
Platelets: 139 10*3/uL — ABNORMAL LOW (ref 150–400)
RBC: 3.75 MIL/uL — AB (ref 3.87–5.11)
RDW: 12.8 % (ref 11.5–15.5)
WBC: 9 10*3/uL (ref 4.0–10.5)

## 2014-04-03 LAB — ANTI-SMITH ANTIBODY: ENA SM Ab Ser-aCnc: <1

## 2014-04-03 LAB — C3 COMPLEMENT: C3 COMPLEMENT: 33 mg/dL — AB (ref 90–180)

## 2014-04-03 LAB — SODIUM, URINE, RANDOM: Sodium, Ur: 24 mEq/L

## 2014-04-03 LAB — PROTIME-INR
INR: 1.08 (ref 0.00–1.49)
Prothrombin Time: 14.2 seconds (ref 11.6–15.2)

## 2014-04-03 LAB — TROPONIN I: Troponin I: 0.33 ng/mL (ref <0.30)

## 2014-04-03 LAB — CREATININE, URINE, RANDOM: Creatinine, Urine: 157.5 mg/dL

## 2014-04-03 LAB — C4 COMPLEMENT: Complement C4, Body Fluid: 2 mg/dL — ABNORMAL LOW (ref 10–40)

## 2014-04-03 LAB — ANTI-DNA ANTIBODY, DOUBLE-STRANDED: ds DNA Ab: 214 IU/mL — ABNORMAL HIGH

## 2014-04-03 LAB — ANA: Anti Nuclear Antibody(ANA): POSITIVE — AB

## 2014-04-03 MED ORDER — SODIUM BICARBONATE 650 MG PO TABS
1300.0000 mg | ORAL_TABLET | Freq: Two times a day (BID) | ORAL | Status: DC
  Administered 2014-04-03 – 2014-04-04 (×3): 1300 mg via ORAL
  Filled 2014-04-03 (×3): qty 2

## 2014-04-03 MED ORDER — HYDRALAZINE HCL 20 MG/ML IJ SOLN
5.0000 mg | Freq: Once | INTRAMUSCULAR | Status: AC
  Administered 2014-04-03: 5 mg via INTRAVENOUS
  Filled 2014-04-03: qty 1

## 2014-04-03 MED ORDER — SODIUM POLYSTYRENE SULFONATE 15 GM/60ML PO SUSP
30.0000 g | Freq: Once | ORAL | Status: AC
  Administered 2014-04-03: 30 g via ORAL
  Filled 2014-04-03: qty 120

## 2014-04-03 NOTE — Consult Note (Signed)
Reason for consult: random renal biopsy  Referring Physician(s): Dr. Arlean HoppingSchertz  History of Present Illness: Robin Mayer is a 28 y.o. female with history of lupus (since 2005) who was recently admitted with worsening facial/ankle edema, HA , chest discomfort, fatigue . 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 She has been off immunosuppressants for about 1 year. Recent labs reveal increasing proteinuria, microhematuria and creatinine of 3.14. Renal US demonstrates  medical renal dz, no hydro/acute finding . Request now received for random renal biopsy to r/o recurrent lupus nephritis.  Past Medical History  Diagnosis Date  . Lupus     Past Surgical History  Procedure Laterality Date  . Laparoscopic ovarian cystectomy Left     Allergies: Levaquin; Novocain; and Sulfa antibiotics  Medications: Prior to Admission medications   Medication Sig Start Date End Date Taking? Authorizing Provider  albuterol (PROVENTIL HFA;VENTOLIN HFA) 108 (90 BASE) MCG/ACT inhaler Inhale 2 puffs into the lungs every 6 (six) hours as needed for wheezing or shortness of breath (wheezing & shortness of breath).   Yes Historical Provider, MD  Multiple Vitamins-Minerals (HM MULTIVITAMIN ADULT GUMMY PO) Take 1 each by mouth daily.   Yes Historical Provider, MD  predniSONE (DELTASONE) 10 MG tablet Day 1 take 6 pills Day 2 take 5 pills Day 3 take 4 pills Day 4 take 3 pills Day 5 take 2 pills Day 6 take 1 pill 02/13/14  Yes Courtney A Forcucci, PA-C  predniSONE (DELTASONE) 20 MG tablet Take 20 mg by mouth once.    Historical Provider, MD    History reviewed. No pertinent family history.  History   Social History  . Marital Status: Single    Spouse Name: N/A    Number of Children: N/A  . Years of Education: N/A   Social History Main Topics  . Smoking status: Never Smoker   . Smokeless tobacco:  Never Used  . Alcohol Use: Yes  . Drug Use: None  . Sexual Activity: Not Currently   Other Topics Concern  . None   Social History Narrative         Review of Systems  Constitutional: Positive for fatigue. Negative for fever and chills.  HENT: Positive for facial swelling.   Respiratory:       Occ cough, dyspnea with exertion  Cardiovascular: Positive for chest pain and leg swelling.  Gastrointestinal: Negative for nausea, vomiting, abdominal pain and blood in stool.  Genitourinary: Positive for hematuria. Negative for dysuria.  Musculoskeletal: Positive for back pain.  Neurological: Positive for headaches.  Hematological: Does not bruise/bleed easily.     Vital Signs: BP 147/81 mmHg  Pulse 70  Temp(Src) 98.3 F (36.8 C) (Oral)  Resp 18  Ht 5\' 3"  (1.6 m)  Wt 199 lb 15.3 oz (90.7 kg)  BMI 35.43 kg/m2  SpO2 100%  LMP 03/29/2014  Physical Exam  Constitutional: She is oriented to person, place, and time. She appears well-developed and well-nourished.  Cardiovascular: Normal rate and regular rhythm.   Pulmonary/Chest: Effort normal and breath sounds normal.  Abdominal: Soft. Bowel sounds are normal. There is no tenderness.  Musculoskeletal: Normal range of motion. She exhibits edema.  Neurological: She is alert and oriented to person, place, and time.    Imaging: Dg Chest 2 View  04/02/2014   CLINICAL DATA:  Left-sided chest pain. Cough for 3-4 days. History of right  lung pleurisy.  EXAM: CHEST  2 VIEW  COMPARISON:  03/27/2014  FINDINGS: Again noted are slightly prominent lung markings in the right lower lobe and not significantly changed. There is no focal airspace disease. Heart and mediastinum are within normal limits. No acute bone abnormality. Trachea is midline. Again noted is thickening in the lung fissures on the lateral view. No evidence for pleural effusions.  IMPRESSION: No active cardiopulmonary disease.   Electronically Signed   By: Richarda OverlieAdam  Henn M.D.   On:  04/02/2014 17:57   [place] Renal  04/02/2014   CLINICAL DATA:  Acute kidney injury.  History of lupus.  EXAM: RENAL/URINARY TRACT ULTRASOUND COMPLETE  COMPARISON:  None.  FINDINGS: Right Kidney:  Length: 9.0 cm. Increased parenchymal echogenicity. No mass or stone. No hydronephrosis.  Left Kidney:  Length: 9.8 cm. Increased parenchymal echogenicity. No mass or stone. No hydronephrosis.  Bladder:  Appears normal for degree of bladder distention.  IMPRESSION: 1. Increased renal parenchymal echogenicity consistent with medical renal disease. No hydronephrosis. No acute finding.   Electronically Signed   By: Amie Portlandavid  Ormond M.D.   On: 04/02/2014 20:38    Labs:  CBC:  Recent Labs  02/13/14 1642 04/02/14 1252 04/03/14 0417  WBC 8.4 10.7* 9.0  HGB 12.7 11.0* 10.9*  HCT 37.0 31.5* 31.0*  PLT 120* 151 139*    COAGS:  Recent Labs  04/03/14 0417  INR 1.08  APTT 29    BMP:  Recent Labs  02/13/14 1642 04/02/14 1252 04/03/14 0417  NA 138 136* 136*  K 4.6 4.9 6.6*  CL 104 106 109  CO2 19 15* 16*  GLUCOSE 105* 111* 138*  BUN 30* 72* 71*  CALCIUM 9.5 8.7 8.2*  CREATININE 1.48* 3.02* 3.14*  GFRNONAA 47* 20* 19*  GFRAA 55* 23* 22*    LIVER FUNCTION TESTS:  Recent Labs  02/13/14 1642 04/02/14 1252  BILITOT 0.5 0.4  AST 18 15  ALT 15 17  ALKPHOS 72 56  PROT 7.8 7.2  ALBUMIN 3.9 3.6    TUMOR MARKERS: No results for input(s): AFPTM, CEA, CA199, CHROMGRNA in the last 8760 hours.  Assessment and Plan: Robin Mayer is a 28 y.o. female with history of lupus (since 2005) who was recently admitted with worsening facial/ankle edema, HA , chest discomfort, fatigue . 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 She has been off immunosuppressants for about 1 year. Recent labs reveal increasing proteinuria, microhematuria and creatinine of 3.14. Renal US demonstrates medical renal dz,  no hydro/acute finding . Request now received for US guided random renal biopsy to r/o recurrent lupus nephritis. Details/risks of procedure d/w pt/father with their understanding and consent. Procedure tent scheduled for 11/4.       I spent a total of 20 minutes face to face in clinical consultation, greater than 50% of which was counseling/coordinating care for random renal biopsy.  Signed: Chinita PesterALLRED,D KEVIN 04/03/2014, 10:33 AM

## 2014-04-03 NOTE — Plan of Care (Signed)
Problem: Phase II Progression Outcomes Goal: Progress activity as tolerated unless otherwise ordered Outcome: Completed/Met Date Met:  04/03/14

## 2014-04-03 NOTE — Progress Notes (Signed)
CRITICAL VALUE ALERT  Critical value received:  Potassium 6.6  Date of notification: 04/03/2014  Time of notification:  5:17 AM   Critical value read back:Yes.    Nurse who received alert:  Rudi RummageStewart, Taiden Raybourn A, RN  MD notified (1st page):  Maren ReamerKaren Kirby, NP  Time of first page: 0520  MD notified (2nd page):  Time of second page:  Responding MD:  Maren ReamerKaren Kirby, NP  Time MD responded:  Orders entered at [facility]0543

## 2014-04-03 NOTE — Plan of Care (Signed)
Problem: Phase II Progression Outcomes Goal: Vital signs remain stable Outcome: Completed/Met Date Met:  04/03/14 Goal: IV changed to normal saline lock Outcome: Completed/Met Date Met:  04/03/14 Goal: Obtain order to discontinue catheter if appropriate Outcome: Not Applicable Date Met:  01/60/10 Goal: Other Phase II Outcomes/Goals Outcome: Completed/Met Date Met:  04/03/14

## 2014-04-03 NOTE — Progress Notes (Signed)
  Robin Mayer Progress Note   Subjective: no new problems, K up 6.6 today, creat 3.1  Filed Vitals:   04/02/14 1613 04/02/14 1630 04/02/14 2157 04/03/14 0546  BP:  136/87 142/94 147/81  Pulse:  71 80 70  Temp: 98.2 F (36.8 C) 98.3 F (36.8 C) 98 F (36.7 C) 98.3 F (36.8 C)  TempSrc: Oral Oral Oral Oral  Resp: $Remo'16 18 18 18  'ryuEz$ Height:  $Remove'5\' 3"'MiEJxqR$  (1.6 m)    Weight:  90.7 kg (199 lb 15.3 oz)    SpO2:  100% 100% 100%   Exam: Alert, not in distress No skin rash or swollen joints Face is moderately swollen No jvd Chest some coarse BS R base, L side clear RRR tachy 1/6 SEM LUSB, no gallop Abd soft, NTND, no HSM or ascites 1-2+ pretib edema bilat LE's Neuro is nf, Ox 3  UA 11/2 (done by undersigned ) > 4+ blood , 3+ protein, active sediment (mild) with dysmorphic rbc's (5-10 /hpf), WBC's 2-3 / hpf, and one degenerating cellular cast w definite wbc's and possible rbc's C3, C4 are low dsDNA is high Renal US 9.0 and 9.8 cm kidneys, Edison Pace   Assessment: 1 Acute on CRF - suspected lupus nephritis, low complements/high dsDNA and active sediment with creat up from baseline 1.3. Moved here from [place] several mos ago. Needs renal biopsy, appreciate IR assistance, planned for tomorrow. Continue pulse IV Medrol and po Cellcept for now.  Await biopsy results to guide further therapy.  2 Vol excess- prob Na/fluid retention from #1. Hold off on diuretics for now. 3 Hyperkalemia - changed to renal diet, s/p Kayex x 1 4 Met acidosis - start NaHCO3 tabs   Plan- as above    Kelly Splinter MD  pager [phone]    cell [phone]  04/03/2014, 12:46 PM     Recent Labs Lab 04/02/14 1252 04/03/14 0417  NA 136* 136*  K 4.9 6.6*  CL 106 109  CO2 15* 16*  GLUCOSE 111* 138*  BUN 72* 71*  CREATININE 3.02* 3.14*  CALCIUM 8.7 8.2*    Recent Labs Lab 04/02/14 1252  AST 15  ALT 17  ALKPHOS 56  BILITOT 0.4  PROT 7.2  ALBUMIN 3.6    Recent Labs Lab 04/02/14 1252  04/03/14 0417  WBC 10.7* 9.0  NEUTROABS 9.5*  --   HGB 11.0* 10.9*  HCT 31.5* 31.0*  MCV 82.2 82.7  PLT 151 139*   . methylPREDNISolone (SOLU-MEDROL) injection  250 mg Intravenous BID  . mycophenolate  1,000 mg Oral BID  . sodium chloride  3 mL Intravenous Q12H   . dextrose 30 mL/hr at 04/02/14 1851   acetaminophen **OR** acetaminophen, ondansetron **OR** ondansetron (ZOFRAN) IV

## 2014-04-03 NOTE — Progress Notes (Signed)
Tama GanderKatherine Schorr, NP notified of patient BP 168/104 manually. Hydralazine 5mg  IV given per order, will continue to monitor BP.

## 2014-04-03 NOTE — Progress Notes (Signed)
  Echocardiogram 2D Echocardiogram has been performed.  Arvil ChacoFoster, Emmanuelle Coxe 04/03/2014, 10:50 AM

## 2014-04-03 NOTE — Progress Notes (Addendum)
PROGRESS NOTE  Elgie CollardLeslie Moch UJW:[id number]:[id number] DOB: 07/04/1985 DOA: 04/02/2014 PCP: Verlon AuBoyd, Tammy Lamonica, MD  HPI/Recap of past 5224 hours: 28 year old female past medical history of Lupus treated with Plaquenil and CellCept with mild impairment of her renal function who was admitted on 11/2 for several weeks of worsening ankle edema, headache and chest discomfort which became markedly worse 3 days prior to admission.  In the emergency room, patient noted to have a serum creatinine of 3.0 (baseline 1.2)and labs consistent with a metabolic acidosis. with the rest of her labs unremarkable. Nephrology consult.  Potassium elevated at 5.5 on 11/3 morning and patient given dose of Kayexalate.  After seeing patient, nephrology concerned about lupus nephritis. Patient treated with aggressive IV steroids, by mouth CellCept and renal biopsy ordered for 11/4.  Today patient herself doing okay. Complains of feeling very swollen on steroids. Assessment/Plan: Principal Problem:   Acute on chronic renal failure: Creatinine up today. Continue to monitor. Nephrology following.see below Active Problems:   AKI (acute kidney injury)   Atypical chest pain: Resolved. May be more musculoskeletal. Enzymes 3 negative. Echocardiogram unrevealing   Metabolic acidosis: Suspect early uremia. Workup otherwise unrevealing   Lupus nephritis:. On IV steroids. Renal biopsy tomorrow.   Code Status: full code  Family Communication: dad at the bedside  Disposition Plan: following renal biopsy, home if no further workup plans   Consultants:  nephrology  Procedures:  Planned renal biopsy 11/4  Echocardiogram done 11/3: Preserved ejection fraction, no diastolic dysfunction  Antibiotics:  none   Objective: BP 147/81 mmHg  Pulse 70  Temp(Src) 98.3 F (36.8 C) (Oral)  Resp 18  Ht 5\' 3"  (1.6 m)  Wt 90.7 kg (199 lb 15.3 oz)  BMI 35.43 kg/m2  SpO2 100%  LMP 03/29/2014  Intake/Output Summary (Last 24 hours) at  04/03/14 1458 Last data filed at 04/03/14 0953  Gross per 24 hour  Intake  386.5 ml  Output    425 ml  Net  -38.5 ml   Filed Weights   04/02/14 1630  Weight: 90.7 kg (199 lb 15.3 oz)    Exam:   General:  Alert and oriented 3, no acute distress  Cardiovascular: regular rate and rhythm, S1-S2  Respiratory: clear to auscultation bilaterally  Abdomen: soft, nontender, nondistended, positive bowel sounds  Musculoskeletal: clubbing or cyanosis, +1 edema   Data Reviewed: Basic Metabolic Panel:  Recent Labs Lab 04/02/14 1252 04/03/14 0417  NA 136* 136*  K 4.9 6.6*  CL 106 109  CO2 15* 16*  GLUCOSE 111* 138*  BUN 72* 71*  CREATININE 3.02* 3.14*  CALCIUM 8.7 8.2*   Liver Function Tests:  Recent Labs Lab 04/02/14 1252  AST 15  ALT 17  ALKPHOS 56  BILITOT 0.4  PROT 7.2  ALBUMIN 3.6    Recent Labs Lab 04/02/14 1252  LIPASE 40   No results for input(s): AMMONIA in the last 168 hours. CBC:  Recent Labs Lab 04/02/14 1252 04/03/14 0417  WBC 10.7* 9.0  NEUTROABS 9.5*  --   HGB 11.0* 10.9*  HCT 31.5* 31.0*  MCV 82.2 82.7  PLT 151 139*   Cardiac Enzymes:    Recent Labs Lab 04/02/14 1710 04/02/14 2205 04/03/14 0417  TROPONINI 0.39* 0.36* 0.33*   BNP (last 3 results) No results for input(s): PROBNP in the last 8760 hours. CBG: No results for input(s): GLUCAP in the last 168 hours.  No results found for this or any previous visit (from the past 240 hour(s)).  Studies: Dg Chest 2 View  04/02/2014   CLINICAL DATA:  Left-sided chest pain. Cough for 3-4 days. History of right lung pleurisy.  EXAM: CHEST  2 VIEW  COMPARISON:  03/27/2014  FINDINGS: Again noted are slightly prominent lung markings in the right lower lobe and not significantly changed. There is no focal airspace disease. Heart and mediastinum are within normal limits. No acute bone abnormality. Trachea is midline. Again noted is thickening in the lung fissures on the lateral view. No  evidence for pleural effusions.  IMPRESSION: No active cardiopulmonary disease.   Electronically Signed   By: Richarda OverlieAdam  Henn M.D.   On: 04/02/2014 17:57   [place] Renal  04/02/2014   CLINICAL DATA:  Acute kidney injury.  History of lupus.  EXAM: RENAL/URINARY TRACT ULTRASOUND COMPLETE  COMPARISON:  None.  FINDINGS: Right Kidney:  Length: 9.0 cm. Increased parenchymal echogenicity. No mass or stone. No hydronephrosis.  Left Kidney:  Length: 9.8 cm. Increased parenchymal echogenicity. No mass or stone. No hydronephrosis.  Bladder:  Appears normal for degree of bladder distention.  IMPRESSION: 1. Increased renal parenchymal echogenicity consistent with medical renal disease. No hydronephrosis. No acute finding.   Electronically Signed   By: Amie Portlandavid  Ormond M.D.   On: 04/02/2014 20:38    Scheduled Meds: . methylPREDNISolone (SOLU-MEDROL) injection  250 mg Intravenous BID  . mycophenolate  1,000 mg Oral BID  . sodium bicarbonate  1,300 mg Oral BID  . sodium chloride  3 mL Intravenous Q12H    Continuous Infusions:    Time spent: 15 minutes  Hollice EspyKRISHNAN,Saharsh Sterling K  [facility] Pager [phone]. If 7PM-7AM, please contact night-coverage at [email or web address], password [email or web address] 04/03/2014, 2:58 PM  LOS: 1 day

## 2014-04-03 NOTE — Progress Notes (Signed)
Bilateral lower extremity venous duplex completed:  No evidence of DVT, superficial thrombosis, or Baker's cyst.   

## 2014-04-04 ENCOUNTER — Inpatient Hospital Stay (HOSPITAL_COMMUNITY): Payer: BC Managed Care – PPO

## 2014-04-04 DIAGNOSIS — M3214 Glomerular disease in systemic lupus erythematosus: Principal | ICD-10-CM

## 2014-04-04 LAB — CBC
HCT: 30.4 % — ABNORMAL LOW (ref 36.0–46.0)
HEMOGLOBIN: 10.6 g/dL — AB (ref 12.0–15.0)
MCH: 28.8 pg (ref 26.0–34.0)
MCHC: 34.9 g/dL (ref 30.0–36.0)
MCV: 82.6 fL (ref 78.0–100.0)
Platelets: 154 10*3/uL (ref 150–400)
RBC: 3.68 MIL/uL — AB (ref 3.87–5.11)
RDW: 13 % (ref 11.5–15.5)
WBC: 12.7 10*3/uL — ABNORMAL HIGH (ref 4.0–10.5)

## 2014-04-04 LAB — COMPLEMENT, TOTAL: Compl, Total (CH50): <10 U/mL — ABNORMAL LOW (ref 31–60)

## 2014-04-04 LAB — BASIC METABOLIC PANEL
Anion gap: 14 (ref 5–15)
BUN: 77 mg/dL — ABNORMAL HIGH (ref 6–23)
CO2: 14 meq/L — AB (ref 19–32)
Calcium: 8.4 mg/dL (ref 8.4–10.5)
Chloride: 109 mEq/L (ref 96–112)
Creatinine, Ser: 3.16 mg/dL — ABNORMAL HIGH (ref 0.50–1.10)
GFR calc Af Amer: 22 mL/min — ABNORMAL LOW (ref >90)
GFR calc non Af Amer: 19 mL/min — ABNORMAL LOW (ref >90)
Glucose, Bld: 126 mg/dL — ABNORMAL HIGH (ref 70–99)
POTASSIUM: 5.1 meq/L (ref 3.7–5.3)
SODIUM: 137 meq/L (ref 137–147)

## 2014-04-04 MED ORDER — MIDAZOLAM HCL 2 MG/2ML IJ SOLN
INTRAMUSCULAR | Status: DC | PRN
  Administered 2014-04-04 (×4): 1 mg via INTRAVENOUS

## 2014-04-04 MED ORDER — AMLODIPINE BESYLATE 5 MG PO TABS
5.0000 mg | ORAL_TABLET | Freq: Every evening | ORAL | Status: DC
  Administered 2014-04-04: 5 mg via ORAL
  Filled 2014-04-04 (×2): qty 1

## 2014-04-04 MED ORDER — ALBUTEROL SULFATE (2.5 MG/3ML) 0.083% IN NEBU
2.5000 mg | INHALATION_SOLUTION | Freq: Once | RESPIRATORY_TRACT | Status: AC
  Administered 2014-04-04: 2.5 mg via RESPIRATORY_TRACT
  Filled 2014-04-04: qty 3

## 2014-04-04 MED ORDER — LABETALOL HCL 100 MG PO TABS
100.0000 mg | ORAL_TABLET | Freq: Two times a day (BID) | ORAL | Status: DC
  Filled 2014-04-04: qty 1

## 2014-04-04 MED ORDER — SODIUM BICARBONATE 650 MG PO TABS
650.0000 mg | ORAL_TABLET | Freq: Two times a day (BID) | ORAL | Status: DC
  Administered 2014-04-04 – 2014-04-05 (×2): 650 mg via ORAL
  Filled 2014-04-04 (×2): qty 1

## 2014-04-04 MED ORDER — FENTANYL CITRATE 0.05 MG/ML IJ SOLN
INTRAMUSCULAR | Status: AC
  Filled 2014-04-04: qty 6

## 2014-04-04 MED ORDER — LABETALOL HCL 100 MG PO TABS
100.0000 mg | ORAL_TABLET | Freq: Two times a day (BID) | ORAL | Status: DC
  Administered 2014-04-04 – 2014-04-05 (×2): 100 mg via ORAL
  Filled 2014-04-04 (×3): qty 1

## 2014-04-04 MED ORDER — MIDAZOLAM HCL 2 MG/2ML IJ SOLN
INTRAMUSCULAR | Status: AC
  Filled 2014-04-04: qty 6

## 2014-04-04 MED ORDER — FLUMAZENIL 0.5 MG/5ML IV SOLN
INTRAVENOUS | Status: AC
  Filled 2014-04-04: qty 5

## 2014-04-04 MED ORDER — FUROSEMIDE 10 MG/ML IJ SOLN
60.0000 mg | Freq: Two times a day (BID) | INTRAMUSCULAR | Status: DC
  Administered 2014-04-04: 60 mg via INTRAVENOUS
  Filled 2014-04-04 (×2): qty 6

## 2014-04-04 MED ORDER — NALOXONE HCL 0.4 MG/ML IJ SOLN
INTRAMUSCULAR | Status: AC
  Filled 2014-04-04: qty 1

## 2014-04-04 MED ORDER — FENTANYL CITRATE 0.05 MG/ML IJ SOLN
INTRAMUSCULAR | Status: DC | PRN
  Administered 2014-04-04: 50 ug via INTRAVENOUS
  Administered 2014-04-04 (×2): 25 ug via INTRAVENOUS

## 2014-04-04 NOTE — Progress Notes (Signed)
[facility] PROGRESS NOTE  Robin Mayer AOZ:[id number] DOB: 11/24/1985 DOA: 04/02/2014  PCP: Verlon AuBoyd, Tammy Lamonica, MD  Brief HPI:  28 year old female past medical history of Lupus treated with Plaquenil and CellCept with mild impairment of her renal function who was admitted on 11/2 for several weeks of worsening ankle edema, headache and chest discomfort which became markedly worse 3 days prior to admission. In the emergency room, patient noted to have a serum creatinine of 3.0 (baseline 1.2)and labs consistent with a metabolic acidosis. with the rest of her labs unremarkable. Nephrology consult.After seeing patient, nephrology concerned about lupus nephritis. Patient treated with aggressive IV steroids, by mouth CellCept and renal biopsy 11/4.  Past medical history:  Past Medical History  Diagnosis Date  . Lupus     Consultants: Nephrology  Procedures:   Renal Biopsy 11/4  2 D ECHO Study Conclusions - Left ventricle: The cavity size was normal. Systolic function was vigorous. The estimated ejection fraction was in the range of 65% to 70%. Wall motion was normal; there were no regional wallmotion abnormalities. Left ventricular diastolic functionparameters were normal. - Aortic valve: Trileaflet; normal thickness leaflets. There was noregurgitation. - Aortic root: The aortic root was normal in size. - Mitral valve: Structurally normal valve. There was mildregurgitation directed centrally. - Left atrium: The atrium was mildly dilated. - Right ventricle: Systolic function was normal. - Right atrium: The atrium was normal in size. - Tricuspid valve: There was mild regurgitation. - Inferior vena cava: The vessel was normal in size. - Pericardium, extracardiac: There was no pericardial effusion. Impressions: - Normal biventricular size and systolic function. Mild mitral and tricuspid regurgitation. Mildly dilated left atrium. Normal filling pressures and  RVSP.  Antibiotics: None  Subjective: Patient complains of pain at biopsy site. She just returned from IR.  Objective: Vital Signs  Filed Vitals:   04/04/14 1110 04/04/14 1125 04/04/14 1140 04/04/14 1346  BP: 135/76 130/72 137/79 150/99  Pulse: 77 72 74 96  Temp:  98.2 F (36.8 C) 98.1 F (36.7 C) 98.1 F (36.7 C)  TempSrc:  Oral Oral Oral  Resp: 16 16 16 16   Height:      Weight:      SpO2: 100% 100% 100% 100%    Intake/Output Summary (Last 24 hours) at 04/04/14 1549 Last data filed at 04/04/14 1352  Gross per 24 hour  Intake    532 ml  Output   1100 ml  Net   -568 ml   Filed Weights   04/02/14 1630 04/03/14 1705 04/04/14 0512  Weight: 90.7 kg (199 lb 15.3 oz) 90.992 kg (200 lb 9.6 oz) 91.354 kg (201 lb 6.4 oz)    General appearance: alert, cooperative, appears stated age and no distress Resp: clear to auscultation bilaterally Cardio: regular rate and rhythm, S1, S2 normal, no murmur, click, rub or gallop GI: soft, non-tender; bowel sounds normal; no masses,  no organomegaly Extremities: extremities normal, atraumatic, no cyanosis or edema Bandaid noted left flank. No swelling noted.  Lab Results:  Basic Metabolic Panel:  Recent Labs Lab 04/02/14 1252 04/03/14 0417 04/04/14 0520  NA 136* 136* 137  K 4.9 6.6* 5.1  CL 106 109 109  CO2 15* 16* 14*  GLUCOSE 111* 138* 126*  BUN 72* 71* 77*  CREATININE 3.02* 3.14* 3.16*  CALCIUM 8.7 8.2* 8.4   Liver Function Tests:  Recent Labs Lab 04/02/14 1252  AST 15  ALT 17  ALKPHOS 56  BILITOT 0.4  PROT 7.2  ALBUMIN  3.6    Recent Labs Lab 04/02/14 1252  LIPASE 40   CBC:  Recent Labs Lab 04/02/14 1252 04/03/14 0417 04/04/14 0520  WBC 10.7* 9.0 12.7*  NEUTROABS 9.5*  --   --   HGB 11.0* 10.9* 10.6*  HCT 31.5* 31.0* 30.4*  MCV 82.2 82.7 82.6  PLT 151 139* 154   Cardiac Enzymes:  Recent Labs Lab 04/02/14 1710 04/02/14 2205 04/03/14 0417  TROPONINI 0.39* 0.36* 0.33*      Studies/Results: Dg Chest 2 View  04/02/2014   CLINICAL DATA:  Left-sided chest pain. Cough for 3-4 days. History of right lung pleurisy.  EXAM: CHEST  2 VIEW  COMPARISON:  03/27/2014  FINDINGS: Again noted are slightly prominent lung markings in the right lower lobe and not significantly changed. There is no focal airspace disease. Heart and mediastinum are within normal limits. No acute bone abnormality. Trachea is midline. Again noted is thickening in the lung fissures on the lateral view. No evidence for pleural effusions.  IMPRESSION: No active cardiopulmonary disease.   Electronically Signed   By: Richarda Overlie M.D.   On: 04/02/2014 17:57   US Renal  04/02/2014   CLINICAL DATA:  Acute kidney injury.  History of lupus.  EXAM: RENAL/URINARY TRACT ULTRASOUND COMPLETE  COMPARISON:  None.  FINDINGS: Right Kidney:  Length: 9.0 cm. Increased parenchymal echogenicity. No mass or stone. No hydronephrosis.  Left Kidney:  Length: 9.8 cm. Increased parenchymal echogenicity. No mass or stone. No hydronephrosis.  Bladder:  Appears normal for degree of bladder distention.  IMPRESSION: 1. Increased renal parenchymal echogenicity consistent with medical renal disease. No hydronephrosis. No acute finding.   Electronically Signed   By: Amie Portland M.D.   On: 04/02/2014 20:38   US Biopsy  04/04/2014   INDICATION: History of lupus, now with proteinuria. Please perform ultrasound-guided random renal biopsy for tissue diagnostic purposes  EXAM: ULTRASOUND GUIDED RENAL BIOPSY  COMPARISON:  Renal ultrasound -04/02/2014  MEDICATIONS: Fentanyl 100 mcg IV; Versed 4 mg IV  ANESTHESIA/SEDATION: Total Moderate Sedation time  13 minutes  COMPLICATIONS: SIR level A: No therapy, no consequence.  The patient developed a small non enlarging left-sided perinephric hematoma following the procedure. The patient remained hemodynamically stable throughout a prolonged observation in the ultrasound department.  PROCEDURE: Informed  written consent was obtained from the patient after a discussion of the risks, benefits and alternatives to treatment. The patient understands and consents the procedure. A timeout was performed prior to the initiation of the procedure.  Ultrasound scanning was performed of the bilateral flanks. Both kidneys were poorly visualized with ultrasound secondary to patient body habitus, increased renal cortical echogenicity and poor sonographic window.  The inferior pole of the left kidney was selected for biopsy due to location and sonographic window. The procedure was planned. The operative site was prepped and draped in the usual sterile fashion. The overlying soft tissues were anesthetized with 1% lidocaine with epinephrine. A 17 gauge core needle biopsy device was advanced into the inferior cortex of the left kidney and 2 core biopsies were obtained under ultrasound guidance. Real time pathologic review confirmed adequate tissue acquisition. Images were saved for documentation purposes. The biopsy device was removed and hemostasis was obtained with manual compression. Post procedural scanning was negative for significant post procedural hemorrhage or additional complication. A dressing was placed. The patient tolerated the procedure well without immediate post procedural complication, though developed a small non enlarging perinephric hemorrhage. The patient remained hemodynamically stable throughout a prolonged  observation in the ultrasound department.  IMPRESSION: Technically successful ultrasound guided left renal biopsy. Procedure complicated by development of a small non enlarging left-sided perinephric hematoma.   Electronically Signed   By: Simonne ComeJohn  Watts M.D.   On: 04/04/2014 14:13    Medications:  Scheduled: . amLODipine  5 mg Oral QPM  . furosemide  60 mg Intravenous BID  . labetalol  100 mg Oral BID  . methylPREDNISolone (SOLU-MEDROL) injection  250 mg Intravenous BID  . mycophenolate  1,000 mg Oral  BID  . sodium bicarbonate  650 mg Oral BID  . sodium chloride  3 mL Intravenous Q12H   Continuous:  AVW:[id number]:acetaminophen **OR** acetaminophen, fentaNYL, midazolam, ondansetron **OR** ondansetron (ZOFRAN) IV  Assessment/Plan:  Principal Problem:   Acute on chronic renal failure Active Problems:   AKI (acute kidney injury)   Atypical chest pain   Metabolic acidosis   Lupus nephritis   Hyperkalemia    Acute on chronic renal failure Creatinine appears to be stable today. Continue to monitor. Nephrology following. Started on Lasix today.   Atypical chest pain Resolved. May be more musculoskeletal. Enzymes 3 negative. Echocardiogram unrevealing  Metabolic acidosis Likely due to renal failure. Sod Bicarb.   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 Renal biopsy done today. Also on Mycophenolate.  Essential Hypertension Continue current meds.  DVT Prophylaxis: Heparin on hold after biopsy.    Code Status: Full Code  Family Communication: Discussed with patient and her father  Disposition Plan: Possible DC in AM. Mobilize.    LOS: 2 days   K[facility] Permanente Surgery CtrKRISHNAN,Dequita Schleicher  [facility] Pager [phone] 04/04/2014, 3:49 PM  If 8PM-8AM, please contact night-coverage at [email or web address], password [email or web address]

## 2014-04-04 NOTE — Plan of Care (Signed)
Problem: Consults Goal: General Medical Patient Education See Patient Education Module for specific education.  Outcome: Progressing Goal: Skin Care Protocol Initiated - if Braden Score 18 or less If consults are not indicated, leave blank or document N/A  Outcome: Not Applicable Date Met:  45/73/34 Goal: Nutrition Consult-if indicated Outcome: Progressing Goal: Diabetes Guidelines if Diabetic/Glucose > 140 If diabetic or lab glucose is > 140 mg/dl - Initiate Diabetes/Hyperglycemia Guidelines & Document Interventions  Outcome: Progressing  Problem: Phase II Progression Outcomes Goal: Discharge plan established Outcome: Progressing  Problem: Phase III Progression Outcomes Goal: Pain controlled on oral analgesia Outcome: Completed/Met Date Met:  04/04/14 Goal: Activity at appropriate level-compared to baseline (UP IN CHAIR FOR HEMODIALYSIS)  Outcome: Progressing Goal: Voiding independently Outcome: Progressing Goal: IV/normal saline lock discontinued Outcome: Progressing Goal: Foley discontinued Outcome: Not Applicable Date Met:  48/30/15 Goal: Discharge plan remains appropriate-arrangements made Outcome: Progressing Goal: Other Phase III Outcomes/Goals Outcome: Not Applicable Date Met:  99/68/95

## 2014-04-04 NOTE — Progress Notes (Addendum)
[facility] Progress Note   Subjective: K+ down, creat same at 3.10.  Had biopsy this am, no immed complications.  Says that her job called and said they she will probably be fired this week due to missing so much work.    Filed Vitals:   04/04/14 1055 04/04/14 1110 04/04/14 1125 04/04/14 1140  BP: 130/70 135/76 130/72 137/79  Pulse: 81 77 72 74  Temp: 98.6 F (37 C)  98.2 F (36.8 C) 98.1 F (36.7 C)  TempSrc: Oral  Oral Oral  Resp: 16 16 16 16   Height:      Weight:      SpO2: 99% 100% 100% 100%   Exam: Alert, not in distress Face is moderately swollen No jvd Chest some coarse BS R base, L side clear RRR tachy 1/6 SEM LUSB, no gallop Abd soft, NTND, no HSM or ascites 1-2+ pretib edema bilat LE's Neuro is nf, Ox 3  UA 11/2 (done by undersigned ) > 4+ blood , 3+ protein, active sediment (mild) with dysmorphic rbc's (5-10 /hpf), WBC's 2-3 / hpf, +wbc's and rbc's within casts - see images below (taken by RSchertz, MD)    C3, C4 are low dsDNA is high Renal US 9.0 and 9.8 cm kidneys, [facility]   Assessment: 1 Acute on CRF - suspected lupus nephritis, 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 Was on Cellcept and Plaquenil for 8-9 years and they were stopped in March 2014 according to pt. Has very low complements/high dsDNA and active sediment with creat up to 3.0 from baseline 1.3. Moved here from [place] several mos ago. Had biopsy today and this is 3rd day of high dose IV solumedrol. Have started her back on Cellcept at 1000 mg bid, will increase to standard 1500 mg bid until biopsy results back with pred 60 mg per day. If biopsy shows serious disease may need IV cytoxan.  2 Vol excess / edema - will start IV lasix at 60 bid for now 3 Hyperkalemia - better, will ask dietician to see 4 Met acidosis - started NaHCO3 tabs 5 HTN - due to vol excess from renal failure/ Na avid state in GN. Have started lowdose norvasc and labetalol.     Plan- she will prob need to be dc'd soon , possibly tomorrow, to deal with her employer.  If everything goes well overnight will prob dc tomorrow on pred 60/d, Cellcept 1500 bid, lasix po , the norvasc and the labetalol. She has an appt with Dr Florene Glen at [facility] next week on Thursday 04/12/14 at 10:15 am to f/u biopsy results.     Kelly Splinter MD  pager [phone]    cell [phone]  04/04/2014, 1:17 PM     Recent Labs Lab 04/02/14 1252 04/03/14 0417 04/04/14 0520  NA 136* 136* 137  K 4.9 6.6* 5.1  CL 106 109 109  CO2 15* 16* 14*  GLUCOSE 111* 138* 126*  BUN 72* 71* 77*  CREATININE 3.02* 3.14* 3.16*  CALCIUM 8.7 8.2* 8.4    Recent Labs Lab 04/02/14 1252  AST 15  ALT 17  ALKPHOS 56  BILITOT 0.4  PROT 7.2  ALBUMIN 3.6    Recent Labs Lab 04/02/14 1252 04/03/14 0417 04/04/14 0520  WBC 10.7* 9.0 12.7*  NEUTROABS 9.5*  --   --   HGB 11.0* 10.9* 10.6*  HCT 31.5* 31.0* 30.4*  MCV 82.2 82.7 82.6  PLT 151 139* 154   . amLODipine  5 mg Oral QPM  . labetalol  100 mg Oral BID  . methylPREDNISolone (SOLU-MEDROL) injection  250 mg Intravenous BID  . mycophenolate  1,000 mg Oral BID  . sodium bicarbonate  1,300 mg Oral BID  . sodium chloride  3 mL Intravenous Q12H     acetaminophen **OR** acetaminophen, fentaNYL, midazolam, ondansetron **OR** ondansetron (ZOFRAN) IV

## 2014-04-04 NOTE — Procedures (Signed)
Technically successful US guided left random renal biopsy.  No immediate complications.

## 2014-04-04 NOTE — Progress Notes (Signed)
Patient stated that she had a little chest tightness.  PCP notified. Will continue to monitor the patient.

## 2014-04-04 NOTE — Plan of Care (Signed)
Problem: Phase II Progression Outcomes Goal: Discharge plan established Outcome: Completed/Met Date Met:  04/04/14     

## 2014-04-05 LAB — BASIC METABOLIC PANEL
Anion gap: 17 — ABNORMAL HIGH (ref 5–15)
BUN: 80 mg/dL — AB (ref 6–23)
CHLORIDE: 112 meq/L (ref 96–112)
CO2: 13 meq/L — AB (ref 19–32)
CREATININE: 3.29 mg/dL — AB (ref 0.50–1.10)
Calcium: 8.6 mg/dL (ref 8.4–10.5)
GFR calc Af Amer: 21 mL/min — ABNORMAL LOW (ref >90)
GFR calc non Af Amer: 18 mL/min — ABNORMAL LOW (ref >90)
Glucose, Bld: 130 mg/dL — ABNORMAL HIGH (ref 70–99)
Potassium: 5.4 mEq/L — ABNORMAL HIGH (ref 3.7–5.3)
Sodium: 142 mEq/L (ref 137–147)

## 2014-04-05 LAB — CBC
HEMATOCRIT: 30.1 % — AB (ref 36.0–46.0)
Hemoglobin: 10.6 g/dL — ABNORMAL LOW (ref 12.0–15.0)
MCH: 28.9 pg (ref 26.0–34.0)
MCHC: 35.2 g/dL (ref 30.0–36.0)
MCV: 82 fL (ref 78.0–100.0)
Platelets: 181 10*3/uL (ref 150–400)
RBC: 3.67 MIL/uL — AB (ref 3.87–5.11)
RDW: 13 % (ref 11.5–15.5)
WBC: 13.4 10*3/uL — AB (ref 4.0–10.5)

## 2014-04-05 MED ORDER — MYCOPHENOLATE MOFETIL 250 MG PO CAPS: 1500.0000 mg | ORAL_CAPSULE | Freq: Two times a day (BID) | ORAL | Status: AC

## 2014-04-05 MED ORDER — LABETALOL HCL 100 MG PO TABS: 100.0000 mg | ORAL_TABLET | Freq: Two times a day (BID) | ORAL | Status: AC

## 2014-04-05 MED ORDER — PREDNISONE 20 MG PO TABS: ORAL_TABLET | ORAL | Status: AC

## 2014-04-05 MED ORDER — SODIUM POLYSTYRENE SULFONATE 15 GM/60ML PO SUSP
30.0000 g | Freq: Once | ORAL | Status: AC
  Administered 2014-04-05: 30 g via ORAL
  Filled 2014-04-05: qty 120

## 2014-04-05 MED ORDER — AMLODIPINE BESYLATE 5 MG PO TABS: 5.0000 mg | ORAL_TABLET | Freq: Every evening | ORAL | Status: AC

## 2014-04-05 MED ORDER — FUROSEMIDE 20 MG PO TABS: 60.0000 mg | ORAL_TABLET | Freq: Two times a day (BID) | ORAL | Status: AC

## 2014-04-05 MED ORDER — SODIUM BICARBONATE 650 MG PO TABS: 1300.0000 mg | ORAL_TABLET | Freq: Three times a day (TID) | ORAL | Status: AC

## 2014-04-05 MED ORDER — FUROSEMIDE 10 MG/ML IJ SOLN: 80.0000 mg | Freq: Two times a day (BID) | INTRAMUSCULAR | Status: DC

## 2014-04-05 NOTE — Plan of Care (Signed)
Problem: Food- and Nutrition-Related Knowledge Deficit (NB-1.1) Goal: Nutrition education Formal process to instruct or train a patient/client in a skill or to impart knowledge to help patients/clients voluntarily manage or modify food choices and eating behavior to maintain or improve health. Outcome: Completed/Met Date Met:  04/05/14 RD received consult regarding low potassium diet education  Pt currently on Renal diet w/1200 ml fluid restriction. Eating >75% of meals. Denied any changes in appetite or weight pta.  Reviewed importance of current renal diet modifications, and necessity to limit intake of high potassium, sodium, and phosphorus containing foods items in individuals with compromised kidney functions.  K elevated at 6.6; however trending down and currently at 5.4. Provided pt with "Potassium Content of Foods" list from the Academy of Nutrition and Dietetics. Encouraged pt to consume high potassium fruits and vegetables maximum of one serving each daily; with incorporation of medium and low potassium foods as substitutions. Reviewed possible alternatives at meals and snack items  Pt verbalized understanding. Expect good compliance as pt with [employer] in [employer], asked appropriate questions, and had evident good family support at home.  Following per protocols. Please consult as needed.  Robin Abide MS RD LDN Clinical Dietitian [phone]

## 2014-04-05 NOTE — Plan of Care (Signed)
Problem: Phase III Progression Outcomes Goal: Activity at appropriate level-compared to baseline (UP IN CHAIR FOR HEMODIALYSIS)  Outcome: Completed/Met Date Met:  04/05/14

## 2014-04-05 NOTE — Plan of Care (Signed)
Problem: Phase III Progression Outcomes Goal: Voiding independently Outcome: Completed/Met Date Met:  04/05/14 Goal: IV/normal saline lock discontinued Outcome: Completed/Met Date Met:  04/05/14 Goal: Discharge plan remains appropriate-arrangements made Outcome: Completed/Met Date Met:  04/05/14

## 2014-04-05 NOTE — Discharge Instructions (Signed)
Lupus Lupus (also called systemic lupus erythematosus, SLE) is a disorder of the body's natural defense system (immune system). In lupus, the immune system attacks various areas of the body (autoimmune disease). CAUSES The cause is unknown. However, lupus runs in families. Certain genes can make you more likely to develop lupus. It is 10 times more common in women than in men. Lupus is also more common in African Americans and Asians. Other factors also play a role, such as viruses (Epstein-Barr virus, EBV), stress, hormones, cigarette smoke, and certain drugs. SYMPTOMS Lupus can affect many parts of the body, including the joints, skin, kidneys, lungs, heart, nervous system, and blood vessels. The signs and symptoms of lupus differ from person to person. The disease can range from mild to life-threatening. Typical features of lupus include:  Butterfly-shaped rash over the face.  Arthritis involving one or more joints.  Kidney disease.  Fever, weight loss, hair loss, fatigue.  Poor circulation in the fingers and toes (Raynaud's disease).  Chest pain when taking deep breaths. Abdominal pain may also occur.  Skin rash in areas exposed to the sun.  Sores in the mouth and nose. DIAGNOSIS Diagnosing lupus can take a long time and is often difficult. An exam and an accurate account of your symptoms and health problems is very important. Blood tests are necessary, though no single test can confirm or rule out lupus. Most people with lupus test positive for antinuclear antibodies (ANA) on a blood test. Additional blood tests, a urine test (urinalysis), and sometimes a kidney or skin tissue sample (biopsy) can help to confirm or rule out lupus. TREATMENT There is no cure for lupus. Your caregiver will develop a treatment plan based on your age, sex, health, symptoms, and lifestyle. The goals are to prevent flares, to treat them when they do occur, and to minimize organ damage and complications. How  the disease may affect each person varies widely. Most people with lupus can live normal lives, but this disorder must be carefully monitored. Treatment must be adjusted as necessary to prevent serious complications. Medicines used for treatment:  Nonsteroidal anti-inflammatory drugs (NSAIDs) decrease inflammation and can help with chest pain, joint pain, and fevers. Examples include ibuprofen and naproxen.  Antimalarial drugs were designed to treat malaria. They also treat fatigue, joint pain, skin rashes, and inflammation of the lungs in patients with lupus.  Corticosteroids are powerful hormones that rapidly suppress inflammation. The lowest dose with the highest benefit will be chosen. They can be given by cream, pills, injections, and through the vein (intravenously).  Immunosuppressive drugs block the making of immune cells. They may be used for kidney or nerve disease. HOME CARE INSTRUCTIONS  Exercise. Low-impact activities can usually help keep joints flexible without being too strenuous.  Rest after periods of exercise.  Avoid excessive sun exposure.  Follow proper nutrition and take supplements as recommended by your caregiver.  Stress management can be helpful. SEEK MEDICAL CARE IF:  You have increased fatigue.  You develop pain.  You develop a rash.  You have an oral temperature above 102 F (38.9 C).  You develop abdominal discomfort.  You develop a headache.  You experience dizziness. FOR MORE INFORMATION National Institute of Neurological Disorders and Stroke: www.ninds.nih.gov American College of Rheumatology: www.rheumatology.org National Institute of Arthritis and Musculoskeletal and Skin Diseases: www.niams.nih.gov Document Released: 05/08/2002 Document Revised: 08/10/2011 Document Reviewed: 08/29/2009 ExitCare Patient Information 2015 [employer]. This information is not intended to replace advice given to you by your health   care provider. Make sure  you discuss any questions you have with your health care provider.  

## 2014-04-05 NOTE — Care Management Note (Signed)
    Page 1 of 1   04/05/2014     1:28:09 PM CARE MANAGEMENT NOTE 04/05/2014  Patient:  Robin Mayer,Robin Mayer   Account Number:  [id number]  Date Initiated:  04/05/2014  Documentation initiated by:  Robin Mayer,Robin Mayer  Subjective/Objective Assessment:   28 Y/O F ADMITTED W/ACUTE ON CHRONIC RENAL FAILURE.ZO:XWRUEHX:LUPUS.     Action/Plan:   FROM HOME.   Anticipated DC Date:  04/07/2014   Anticipated DC Plan:  HOME/SELF CARE      DC Planning Services  CM consult      Choice offered to / List presented to:             Status of service:  In process, will continue to follow Medicare Important Message given?   (If response is "NO", the following Medicare IM given date fields will be blank) Date Medicare IM given:   Medicare IM given by:   Date Additional Medicare IM given:   Additional Medicare IM given by:    Discharge Disposition:    Per UR Regulation:  Reviewed for med. necessity/level of care/duration of stay  If discussed at Long Length of Stay Meetings, dates discussed:    Comments:  04/05/14 Robin Notarianni RN,BSN NCM [phone] NO ANTICIPATED D/C NEEDS.

## 2014-04-05 NOTE — Discharge Summary (Addendum)
[facility]  Physician Discharge Summary   Patient ID: Robin Mayer MRN: [id number] DOB/AGE: 08/05/1985 28 y.o.  Admit date: 04/02/2014 Discharge date: 04/05/2014  PCP: Verlon Au, MD  DISCHARGE DIAGNOSES:  Principal Problem:   Acute on chronic renal failure Active Problems:   AKI (acute kidney injury)   Atypical chest pain   Metabolic acidosis   Lupus nephritis   Hyperkalemia   RECOMMENDATIONS FOR OUTPATIENT FOLLOW UP: 1. Close follow up for labs and with Dr. Lowell Guitar  DISCHARGE CONDITION: fair  Diet recommendation: Renal Diet  Filed Weights   04/03/14 1705 04/04/14 0512 04/05/14 0458  Weight: 90.992 kg (200 lb 9.6 oz) 91.354 kg (201 lb 6.4 oz) 90.9 kg (200 lb 6.4 oz)    INITIAL HISTORY: 28 year old female past medical history of Lupus treated with Plaquenil and CellCept with mild impairment of her renal function who was admitted on 11/2 for several weeks of worsening ankle edema, headache and chest discomfort which became markedly worse 3 days prior to admission. In the emergency room, patient noted to have a serum creatinine of 3.0 (baseline 1.2) and labs consistent with a metabolic acidosis with the rest of her labs unremarkable. Nephrology was consulted.After seeing patient, nephrology concerned about lupus nephritis. Patient treated with aggressive IV steroids, CellCept and renal biopsy 11/4.  Consultations:  Nephrology  IR for renal biopsy  Procedures: Renal Biopsy 11/4  2 D ECHO Study Conclusions - Left ventricle: The cavity size was normal. Systolic function was vigorous. The estimated ejection fraction was in the range of 65% to 70%. Wall motion was normal; there were no regional wallmotion abnormalities. Left ventricular diastolic functionparameters were normal. - Aortic valve: Trileaflet; normal thickness leaflets. There was noregurgitation. - Aortic root: The aortic root was normal in size. - Mitral valve: Structurally normal  valve. There was mildregurgitation directed centrally. - Left atrium: The atrium was mildly dilated. - Right ventricle: Systolic function was normal. - Right atrium: The atrium was normal in size. - Tricuspid valve: There was mild regurgitation. - Inferior vena cava: The vessel was normal in size. - Pericardium, extracardiac: There was no pericardial effusion. Impressions: - Normal biventricular size and systolic function. Mild mitral and tricuspid regurgitation. Mildly dilated left atrium. Normal filling pressures and RVSP.  HOSPITAL COURSE:   Acute on chronic renal failure with Hyperkalemia Patient was admitted and started on Cellcept at higher dose and steroids. Then she was started on Lasix as well. Her creatinine did increase some but patient feels better and has good UO. Her potassium level improved. She was given another dose of Kayexalate today. Discussed with Dr. Arlean Hopping and he thinks patient can be discharged. She will have close follow up with her Nephrologist next week with blood work. Patient agrees with this plan. She will be discharged on Lasix, Prednisone, Cellcept.  Atypical chest pain This has resolved. Probably msculoskeletal. Enzymes 3 negative. Echocardiogram unrevealing.  Metabolic acidosis Likely due to renal failure. Sod Bicarb orally. OP follow up.  Lupus nephritis Biopsy was done and pathology is pending. As above.   Essential Hypertension She will need medications to control her BP. She will be discharged on same. Labetalol and Amlodipine.  Overall stable. Can be discharged per Nephrology.   PERTINENT LABS:  The results of significant diagnostics from this hospitalization (including imaging, microbiology, ancillary and laboratory) are listed below for reference.      Labs: Basic Metabolic Panel:  Recent Labs Lab 04/02/14 1252 04/03/14 0417 04/04/14 0520 04/05/14 0450  NA 136* 136*  137 142  K 4.9 6.6* 5.1 5.4*  CL 106 109 109 112  CO2 15*  16* 14* 13*  GLUCOSE 111* 138* 126* 130*  BUN 72* 71* 77* 80*  CREATININE 3.02* 3.14* 3.16* 3.29*  CALCIUM 8.7 8.2* 8.4 8.6   Liver Function Tests:  Recent Labs Lab 04/02/14 1252  AST 15  ALT 17  ALKPHOS 56  BILITOT 0.4  PROT 7.2  ALBUMIN 3.6    Recent Labs Lab 04/02/14 1252  LIPASE 40   CBC:  Recent Labs Lab 04/02/14 1252 04/03/14 0417 04/04/14 0520 04/05/14 0450  WBC 10.7* 9.0 12.7* 13.4*  NEUTROABS 9.5*  --   --   --   HGB 11.0* 10.9* 10.6* 10.6*  HCT 31.5* 31.0* 30.4* 30.1*  MCV 82.2 82.7 82.6 82.0  PLT 151 139* 154 181   Cardiac Enzymes:  Recent Labs Lab 04/02/14 1710 04/02/14 2205 04/03/14 0417  TROPONINI 0.39* 0.36* 0.33*    IMAGING STUDIES Dg Chest 2 View  04/02/2014   CLINICAL DATA:  Left-sided chest pain. Cough for 3-4 days. History of right lung pleurisy.  EXAM: CHEST  2 VIEW  COMPARISON:  03/27/2014  FINDINGS: Again noted are slightly prominent lung markings in the right lower lobe and not significantly changed. There is no focal airspace disease. Heart and mediastinum are within normal limits. No acute bone abnormality. Trachea is midline. Again noted is thickening in the lung fissures on the lateral view. No evidence for pleural effusions.  IMPRESSION: No active cardiopulmonary disease.   Electronically Signed   By: Richarda OverlieAdam  Henn M.D.   On: 04/02/2014 17:57   [place] Renal  04/02/2014   CLINICAL DATA:  Acute kidney injury.  History of lupus.  EXAM: RENAL/URINARY TRACT ULTRASOUND COMPLETE  COMPARISON:  None.  FINDINGS: Right Kidney:  Length: 9.0 cm. Increased parenchymal echogenicity. No mass or stone. No hydronephrosis.  Left Kidney:  Length: 9.8 cm. Increased parenchymal echogenicity. No mass or stone. No hydronephrosis.  Bladder:  Appears normal for degree of bladder distention.  IMPRESSION: 1. Increased renal parenchymal echogenicity consistent with medical renal disease. No hydronephrosis. No acute finding.   Electronically Signed   By: Amie Portlandavid  Ormond  M.D.   On: 04/02/2014 20:38   [place] Biopsy  04/04/2014   INDICATION: History of lupus, now with proteinuria. Please perform ultrasound-guided random renal biopsy for tissue diagnostic purposes  EXAM: ULTRASOUND GUIDED RENAL BIOPSY  COMPARISON:  Renal ultrasound -04/02/2014  MEDICATIONS: Fentanyl 100 mcg IV; Versed 4 mg IV  ANESTHESIA/SEDATION: Total Moderate Sedation time  13 minutes  COMPLICATIONS: SIR level A: No therapy, no consequence.  The patient developed a small non enlarging left-sided perinephric hematoma following the procedure. The patient remained hemodynamically stable throughout a prolonged observation in the ultrasound department.  PROCEDURE: Informed written consent was obtained from the patient after a discussion of the risks, benefits and alternatives to treatment. The patient understands and consents the procedure. A timeout was performed prior to the initiation of the procedure.  Ultrasound scanning was performed of the bilateral flanks. Both kidneys were poorly visualized with ultrasound secondary to patient body habitus, increased renal cortical echogenicity and poor sonographic window.  The inferior pole of the left kidney was selected for biopsy due to location and sonographic window. The procedure was planned. The operative site was prepped and draped in the usual sterile fashion. The overlying soft tissues were anesthetized with 1% lidocaine with epinephrine. A 17 gauge core needle biopsy device was advanced into the inferior cortex of  the left kidney and 2 core biopsies were obtained under ultrasound guidance. Real time pathologic review confirmed adequate tissue acquisition. Images were saved for documentation purposes. The biopsy device was removed and hemostasis was obtained with manual compression. Post procedural scanning was negative for significant post procedural hemorrhage or additional complication. A dressing was placed. The patient tolerated the procedure well without  immediate post procedural complication, though developed a small non enlarging perinephric hemorrhage. The patient remained hemodynamically stable throughout a prolonged observation in the ultrasound department.  IMPRESSION: Technically successful ultrasound guided left renal biopsy. Procedure complicated by development of a small non enlarging left-sided perinephric hematoma.   Electronically Signed   By: John  Watts M.D.   On: 04/04/2014 14:13    DISCHARGE EXAMINATION: See progress note from earlier today.  DISPOSITION: Home  DiscSimonne Comeharge Instructions    Diet renal [id number]    Complete by:  As directed      Discharge instructions    Complete by:  As directed   Please go for blood work on Monday as explained.     Increase activity slowly    Complete by:  As directed            ALLERGIES:  Allergies  Allergen Reactions  . Levaquin [Levofloxacin In D5w] Anaphylaxis  . Novocain [Procaine] Anaphylaxis  . Sulfa Antibiotics Anaphylaxis    Current Discharge Medication List    START taking these medications   Details  amLODipine (NORVASC) 5 MG tablet Take 1 tablet (5 mg total) by mouth every evening. Qty: 30 tablet, Refills: 0    furosemide (LASIX) 20 MG tablet Take 3 tablets (60 mg total) by mouth 2 (two) times daily. Qty: 180 tablet, Refills: 0    labetalol (NORMODYNE) 100 MG tablet Take 1 tablet (100 mg total) by mouth 2 (two) times daily. Qty: 60 tablet, Refills: 0    mycophenolate (CELLCEPT) 250 MG capsule Take 6 capsules (1,500 mg total) by mouth 2 (two) times daily. Qty: 360 capsule, Refills: 0    sodium bicarbonate 650 MG tablet Take 2 tablets (1,300 mg total) by mouth 3 (three) times daily. Qty: 180 tablet, Refills: 0      CONTINUE these medications which have CHANGED   Details  predniSONE (DELTASONE) 20 MG tablet Take 60mg  (3 tablets) once daily till seen by Dr. Lowell GuitarPowell in follow up. Qty: 90 tablet, Refills: 0      CONTINUE these medications which have NOT  CHANGED   Details  albuterol (PROVENTIL HFA;VENTOLIN HFA) 108 (90 BASE) MCG/ACT inhaler Inhale 2 puffs into the lungs every 6 (six) hours as needed for wheezing or shortness of breath (wheezing & shortness of breath).    Multiple Vitamins-Minerals (HM MULTIVITAMIN ADULT GUMMY PO) Take 1 each by mouth daily.       Follow-up Information    Follow up with Lauris PoagPOWELL,ALVIN C, MD On 04/12/2014.   Specialty:  Nephrology   Why:  on 04/12/14 at 10:15 AM   Contact information:   [address]                          [place] [address] [phone]       TOTAL DISCHARGE TIME: 35 mins  [facility] CenterKRISHNAN,Adahlia Stembridge  [facility] Pager [phone]  04/05/2014, 3:02 PM

## 2014-04-05 NOTE — Progress Notes (Signed)
[facility] PROGRESS NOTE  Robin CollardLeslie Mayer ZOX:[id number] DOB: 11/26/1985 DOA: 04/02/2014  PCP: Verlon AuBoyd, Tammy Lamonica, MD  Brief HPI:  28 year old female past medical history of Lupus treated with Plaquenil and CellCept with mild impairment of her renal function who was admitted on 11/2 for several weeks of worsening ankle edema, headache and chest discomfort which became markedly worse 3 days prior to admission. In the emergency room, patient noted to have a serum creatinine of 3.0 (baseline 1.2)and labs consistent with a metabolic acidosis. with the rest of her labs unremarkable. Nephrology consult.After seeing patient, nephrology concerned about lupus nephritis. Patient treated with aggressive IV steroids, CellCept and renal biopsy 11/4.  Past medical history:  Past Medical History  Diagnosis Date  . Lupus     Consultants: Nephrology  Procedures:   Renal Biopsy 11/4  2 D ECHO Study Conclusions - Left ventricle: The cavity size was normal. Systolic function was vigorous. The estimated ejection fraction was in the range of 65% to 70%. Wall motion was normal; there were no regional wallmotion abnormalities. Left ventricular diastolic functionparameters were normal. - Aortic valve: Trileaflet; normal thickness leaflets. There was noregurgitation. - Aortic root: The aortic root was normal in size. - Mitral valve: Structurally normal valve. There was mildregurgitation directed centrally. - Left atrium: The atrium was mildly dilated. - Right ventricle: Systolic function was normal. - Right atrium: The atrium was normal in size. - Tricuspid valve: There was mild regurgitation. - Inferior vena cava: The vessel was normal in size. - Pericardium, extracardiac: There was no pericardial effusion. Impressions: - Normal biventricular size and systolic function. Mild mitral and tricuspid regurgitation. Mildly dilated left atrium. Normal filling pressures and  RVSP.  Antibiotics: None  Subjective: Patient feels well today. Denies any pain.   Objective: Vital Signs  Filed Vitals:   04/04/14 1346 04/04/14 2055 04/04/14 2100 04/05/14 0458  BP: 150/99 136/81  147/89  Pulse: 96 72  82  Temp: 98.1 F (36.7 C) 98.7 F (37.1 C)  98 F (36.7 C)  TempSrc: Oral Oral  Oral  Resp: 16 16  18   Height:      Weight:    90.9 kg (200 lb 6.4 oz)  SpO2: 100% 99% 96% 100%    Intake/Output Summary (Last 24 hours) at 04/05/14 0841 Last data filed at 04/05/14 0750  Gross per 24 hour  Intake   1047 ml  Output   2000 ml  Net   -953 ml   Filed Weights   04/03/14 1705 04/04/14 0512 04/05/14 0458  Weight: 90.992 kg (200 lb 9.6 oz) 91.354 kg (201 lb 6.4 oz) 90.9 kg (200 lb 6.4 oz)    General appearance: alert, cooperative, appears stated age and no distress Resp: clear to auscultation bilaterally Cardio: regular rate and rhythm, S1, S2 normal, no murmur, click, rub or gallop GI: soft, non-tender; bowel sounds normal; no masses,  no organomegaly Extremities: extremities normal, atraumatic, no cyanosis or edema Bandaid noted left flank. No swelling noted.  Lab Results:  Basic Metabolic Panel:  Recent Labs Lab 04/02/14 1252 04/03/14 0417 04/04/14 0520 04/05/14 0450  NA 136* 136* 137 142  K 4.9 6.6* 5.1 5.4*  CL 106 109 109 112  CO2 15* 16* 14* 13*  GLUCOSE 111* 138* 126* 130*  BUN 72* 71* 77* 80*  CREATININE 3.02* 3.14* 3.16* 3.29*  CALCIUM 8.7 8.2* 8.4 8.6   Liver Function Tests:  Recent Labs Lab 04/02/14 1252  AST 15  ALT 17  ALKPHOS 56  BILITOT 0.4  PROT 7.2  ALBUMIN 3.6    Recent Labs Lab 04/02/14 1252  LIPASE 40   CBC:  Recent Labs Lab 04/02/14 1252 04/03/14 0417 04/04/14 0520 04/05/14 0450  WBC 10.7* 9.0 12.7* 13.4*  NEUTROABS 9.5*  --   --   --   HGB 11.0* 10.9* 10.6* 10.6*  HCT 31.5* 31.0* 30.4* 30.1*  MCV 82.2 82.7 82.6 82.0  PLT 151 139* 154 181   Cardiac Enzymes:  Recent Labs Lab 04/02/14 1710  04/02/14 2205 04/03/14 0417  TROPONINI 0.39* 0.36* 0.33*     Studies/Results: [place] Biopsy  04/04/2014   INDICATION: History of lupus, now with proteinuria. Please perform ultrasound-guided random renal biopsy for tissue diagnostic purposes  EXAM: ULTRASOUND GUIDED RENAL BIOPSY  COMPARISON:  Renal ultrasound -04/02/2014  MEDICATIONS: Fentanyl 100 mcg IV; Versed 4 mg IV  ANESTHESIA/SEDATION: Total Moderate Sedation time  13 minutes  COMPLICATIONS: SIR level A: No therapy, no consequence.  The patient developed a small non enlarging left-sided perinephric hematoma following the procedure. The patient remained hemodynamically stable throughout a prolonged observation in the ultrasound department.  PROCEDURE: Informed written consent was obtained from the patient after a discussion of the risks, benefits and alternatives to treatment. The patient understands and consents the procedure. A timeout was performed prior to the initiation of the procedure.  Ultrasound scanning was performed of the bilateral flanks. Both kidneys were poorly visualized with ultrasound secondary to patient body habitus, increased renal cortical echogenicity and poor sonographic window.  The inferior pole of the left kidney was selected for biopsy due to location and sonographic window. The procedure was planned. The operative site was prepped and draped in the usual sterile fashion. The overlying soft tissues were anesthetized with 1% lidocaine with epinephrine. A 17 gauge core needle biopsy device was advanced into the inferior cortex of the left kidney and 2 core biopsies were obtained under ultrasound guidance. Real time pathologic review confirmed adequate tissue acquisition. Images were saved for documentation purposes. The biopsy device was removed and hemostasis was obtained with manual compression. Post procedural scanning was negative for significant post procedural hemorrhage or additional complication. A dressing was placed. The  patient tolerated the procedure well without immediate post procedural complication, though developed a small non enlarging perinephric hemorrhage. The patient remained hemodynamically stable throughout a prolonged observation in the ultrasound department.  IMPRESSION: Technically successful ultrasound guided left renal biopsy. Procedure complicated by development of a small non enlarging left-sided perinephric hematoma.   Electronically Signed   By: Simonne ComeJohn  Watts M.D.   On: 04/04/2014 14:13    Medications:  Scheduled: . amLODipine  5 mg Oral QPM  . furosemide  80 mg Intravenous BID  . labetalol  100 mg Oral BID  . methylPREDNISolone (SOLU-MEDROL) injection  250 mg Intravenous BID  . mycophenolate  1,000 mg Oral BID  . sodium bicarbonate  650 mg Oral BID  . sodium chloride  3 mL Intravenous Q12H  . sodium polystyrene  30 g Oral Once   Continuous:  JYN:[id number]:acetaminophen **OR** acetaminophen, fentaNYL, ondansetron **OR** ondansetron (ZOFRAN) IV  Assessment/Plan:  Principal Problem:   Acute on chronic renal failure Active Problems:   AKI (acute kidney injury)   Atypical chest pain   Metabolic acidosis   Lupus nephritis   Hyperkalemia    Acute on chronic renal failure with Hyperkalemia Creatinine has climbed some today. Give kayexalate for hyperkalemia. Continue to monitor. Nephrology following. Started on Lasix 11/4.  Atypical chest pain Resolved. May  be more musculoskeletal. Enzymes 3 negative. Echocardiogram unrevealing  Metabolic acidosis Likely due to renal failure. Sod Bicarb.   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 Renal biopsy done 11/4. Pathology report pending. Also on Mycophenolate.  Essential Hypertension Continue current meds.  DVT Prophylaxis: SCD's Code Status: Full Code  Family Communication: Discussed with patient  Disposition Plan: Per Nephrology.    LOS: 3 days   [facility]  [facility] Pager [phone] 04/05/2014, 8:41 AM  If 8PM-8AM, please  contact night-coverage at [email or web address], password [facility]

## 2014-04-16 ENCOUNTER — Encounter (HOSPITAL_COMMUNITY): Payer: Self-pay

## 2016-02-07 IMAGING — US US RENAL
1 series · 14 of 25 positions shown · non-contrast
Comparison: None.

CLINICAL DATA: Acute kidney injury.  History of lupus.

EXAM:
RENAL/URINARY TRACT ULTRASOUND COMPLETE

[Series 1: us renal · 0.22mm/px · 14 of 36 slices shown]
[im 1/36]
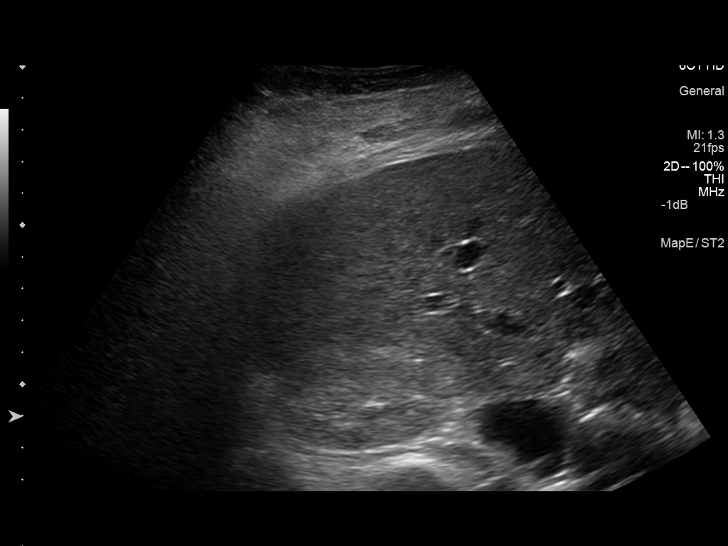
[im 3/36]
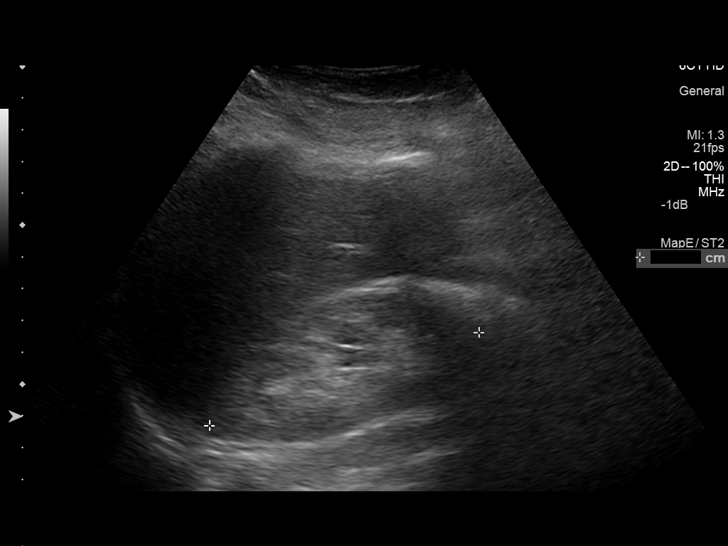
[im 6/36]
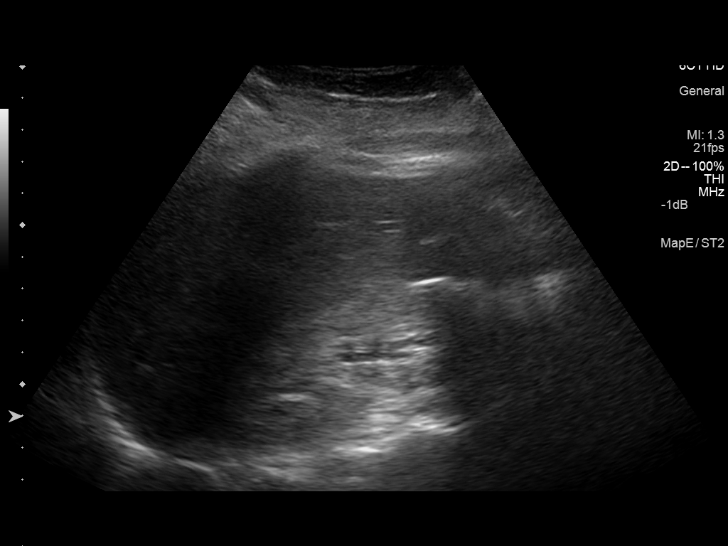
[im 9/36]
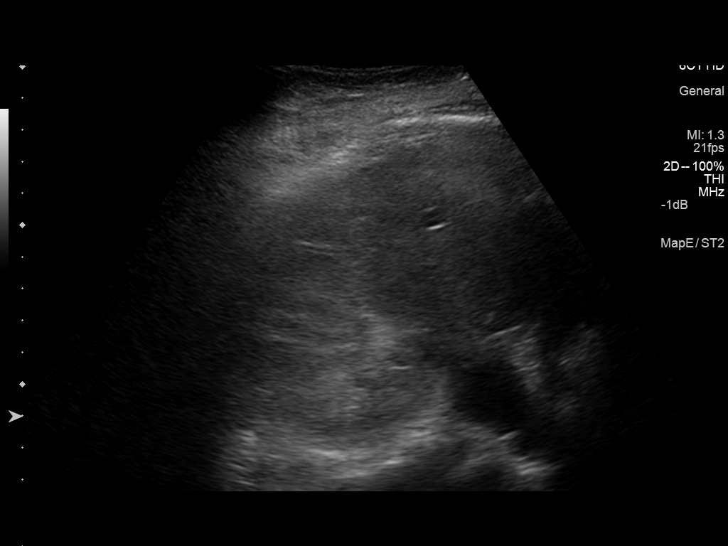
[im 12/36]
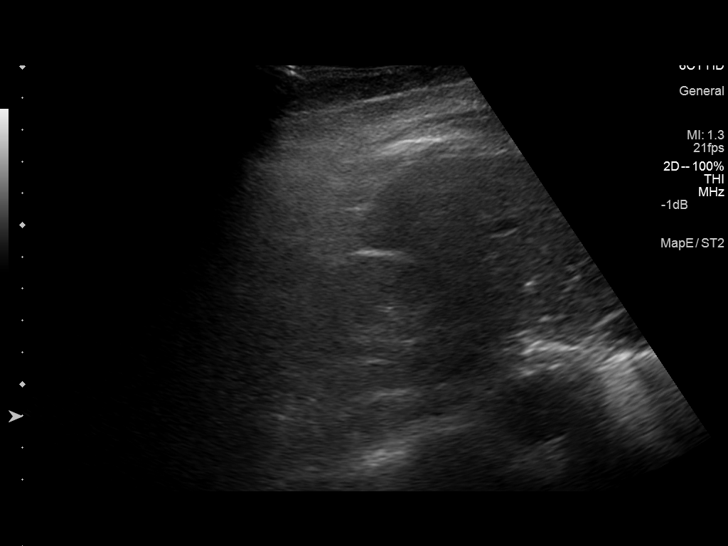
[im 14/36]
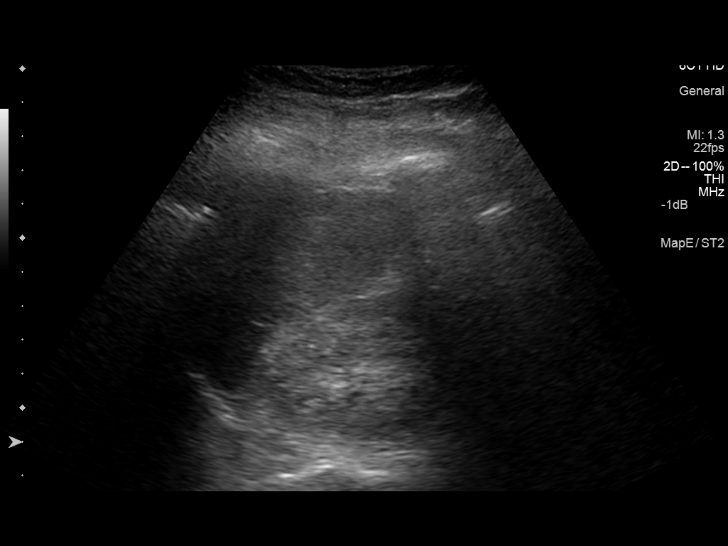
[im 17/36]
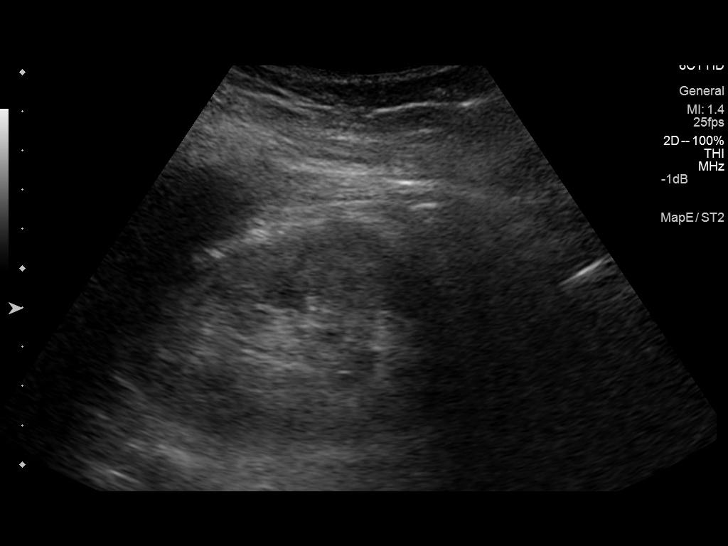
[im 19/36]
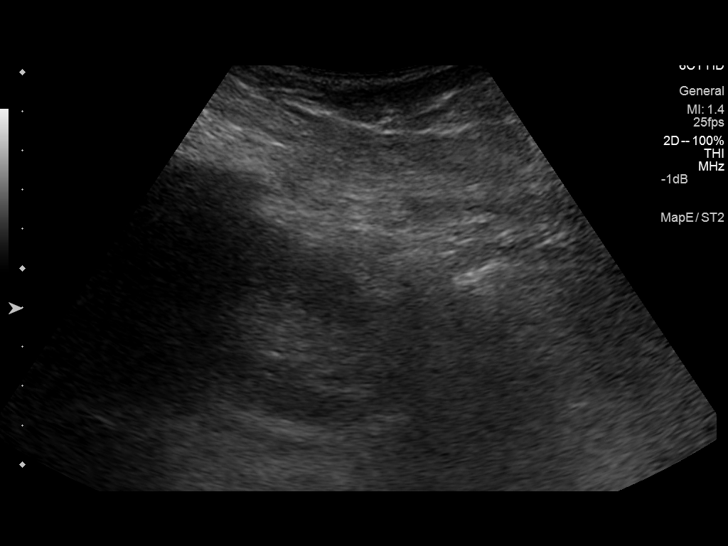
[im 22/36]
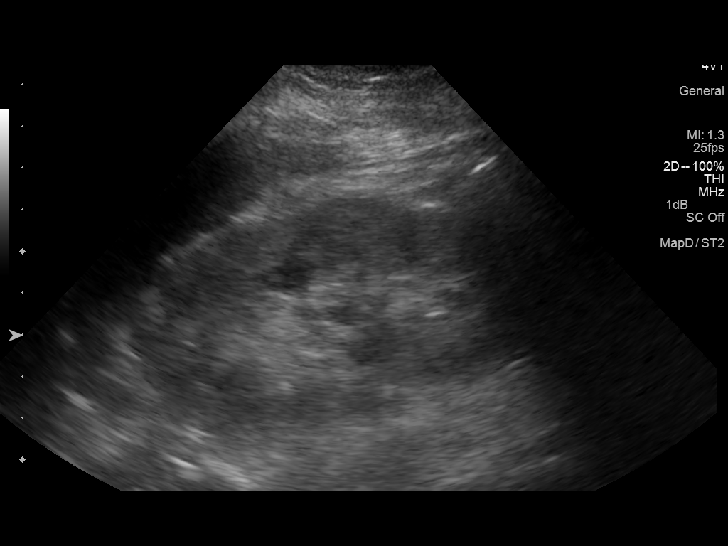
[im 24/36]
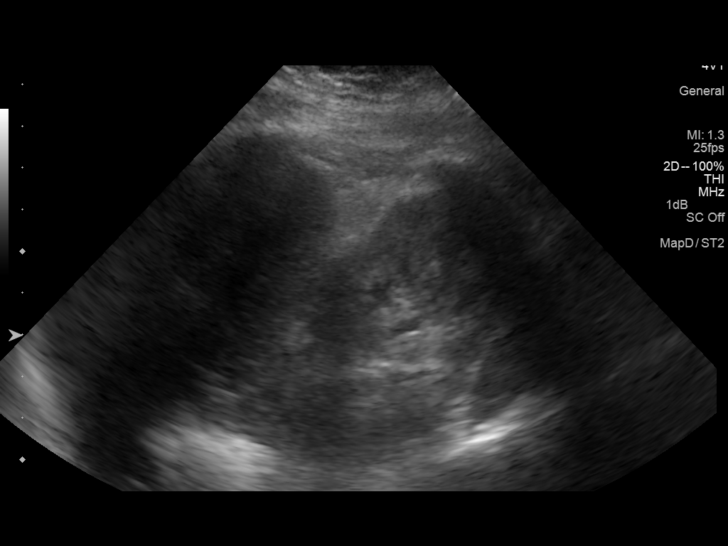
[im 27/36]
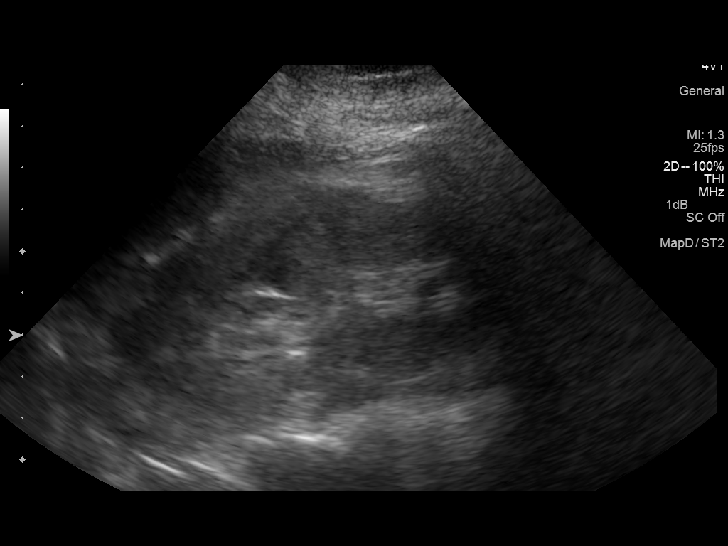
[im 30/36]
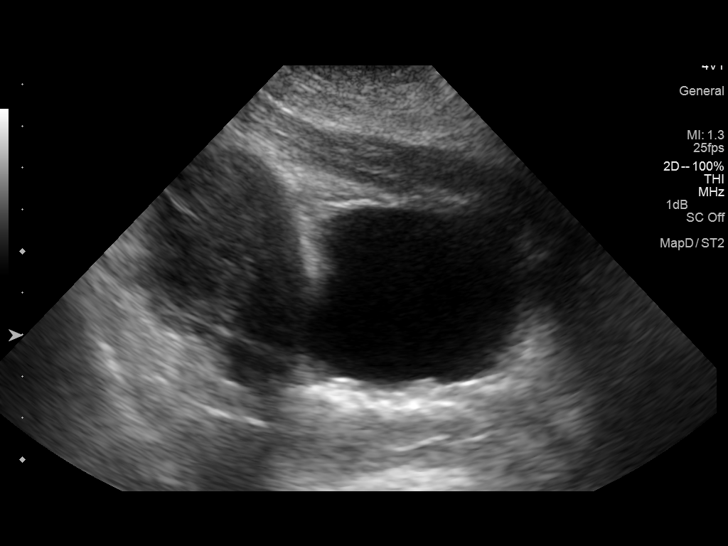
[im 33/36]
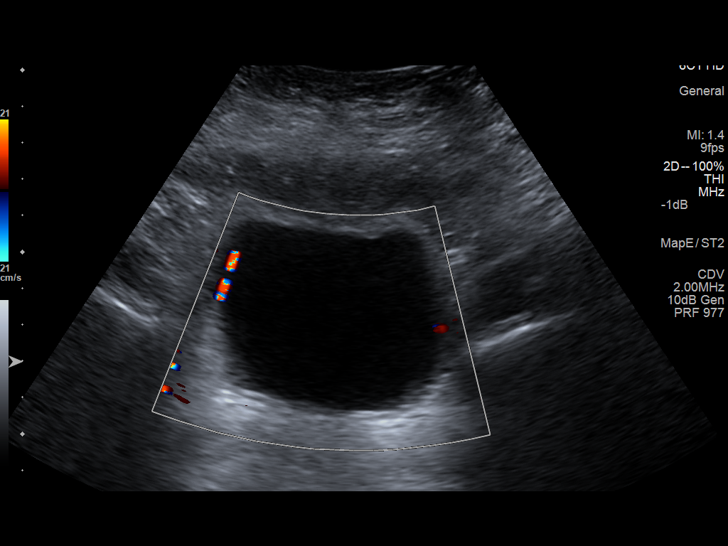
[im 36/36]
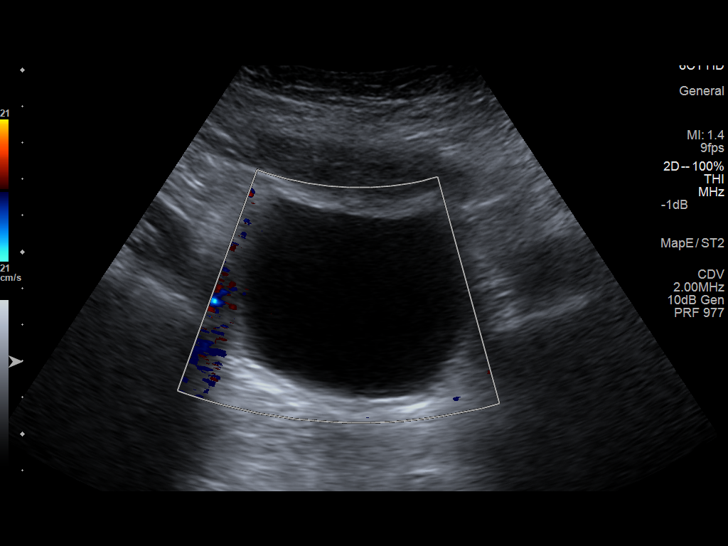

[14 of 25 positions shown; findings below may reference images not displayed]

FINDINGS: Right Kidney:

Length: 9.0 cm. Increased parenchymal echogenicity. No mass or
stone. No hydronephrosis.

Left Kidney:

Length: 9.8 cm. Increased parenchymal echogenicity. No mass or
stone. No hydronephrosis.

Bladder:

Appears normal for degree of bladder distention.
IMPRESSION: 1. Increased renal parenchymal echogenicity consistent with medical
renal disease. No hydronephrosis. No acute finding.

## 2016-02-09 IMAGING — US US BIOPSY
1 series · 8 of 8 positions shown · non-contrast
Comparison: Renal ultrasound -04/02/2014

INDICATION: History of lupus, now with proteinuria. Please perform
ultrasound-guided random renal biopsy for tissue diagnostic purposes

EXAM:
ULTRASOUND GUIDED RENAL BIOPSY

[Series 1: us biopsy · 0.22mm/px · 8 of 8 slices shown]
[im 1/8]
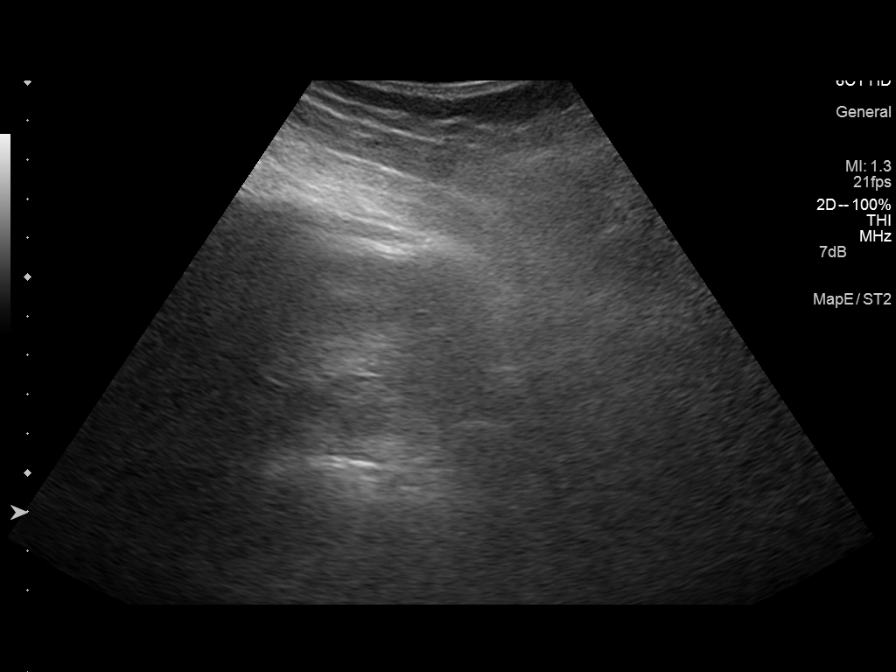
[im 2/8]
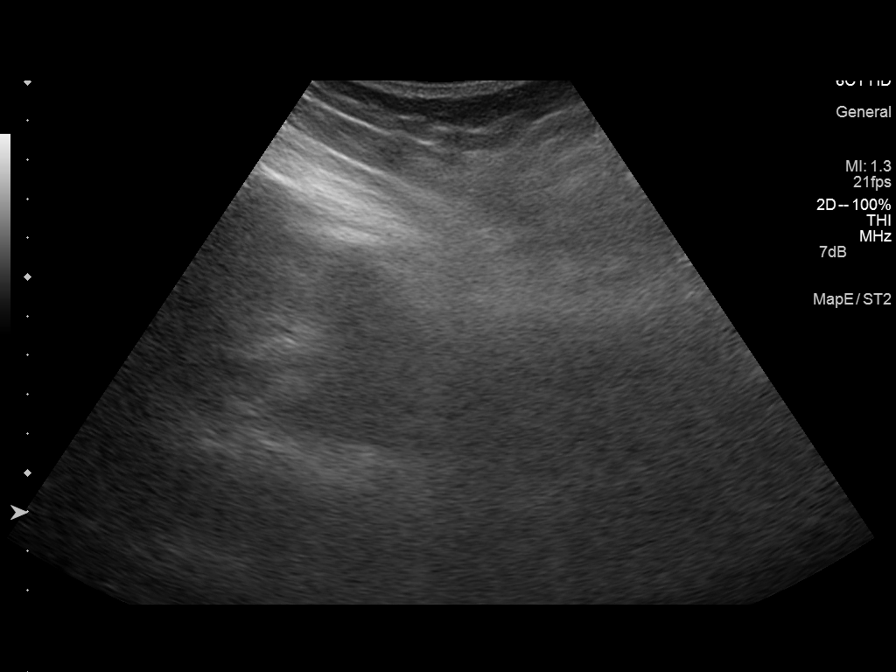
[im 3/8]
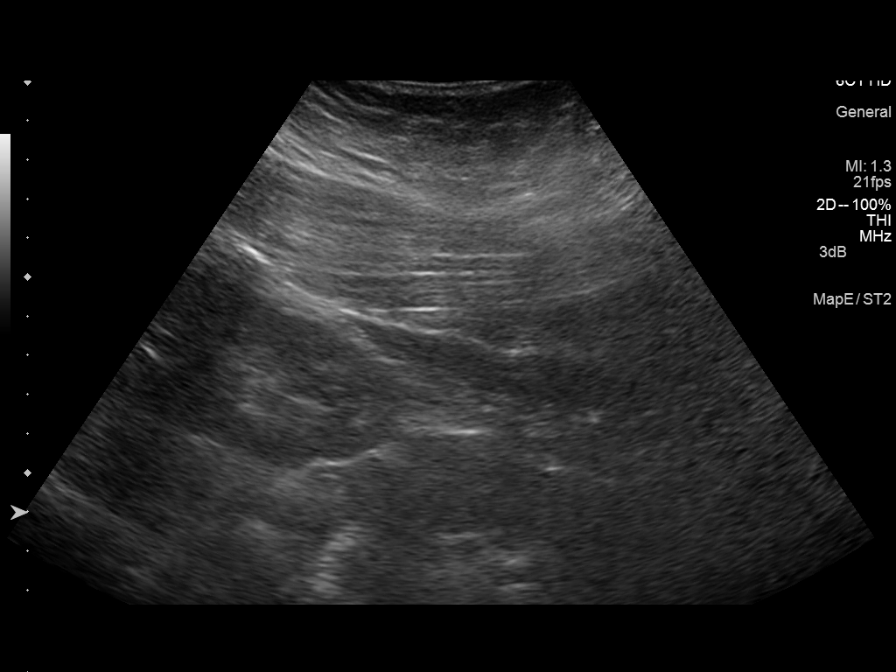
[im 4/8]
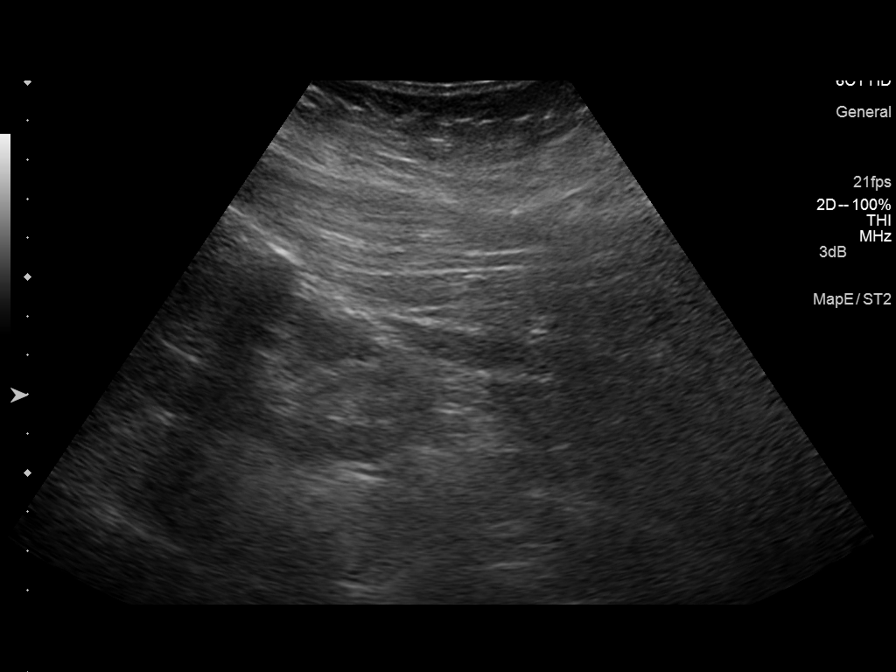
[im 5/8]
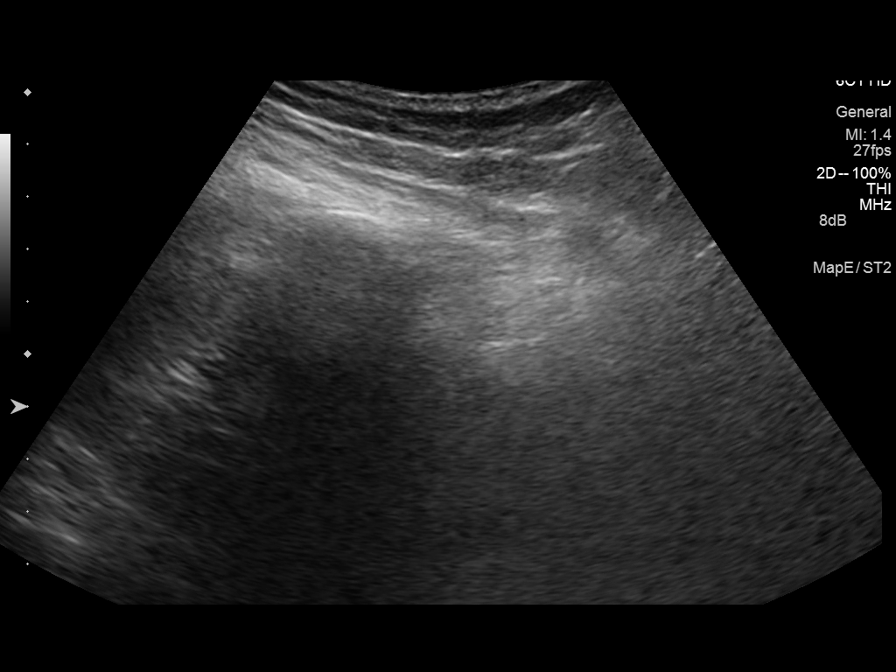
[im 6/8]
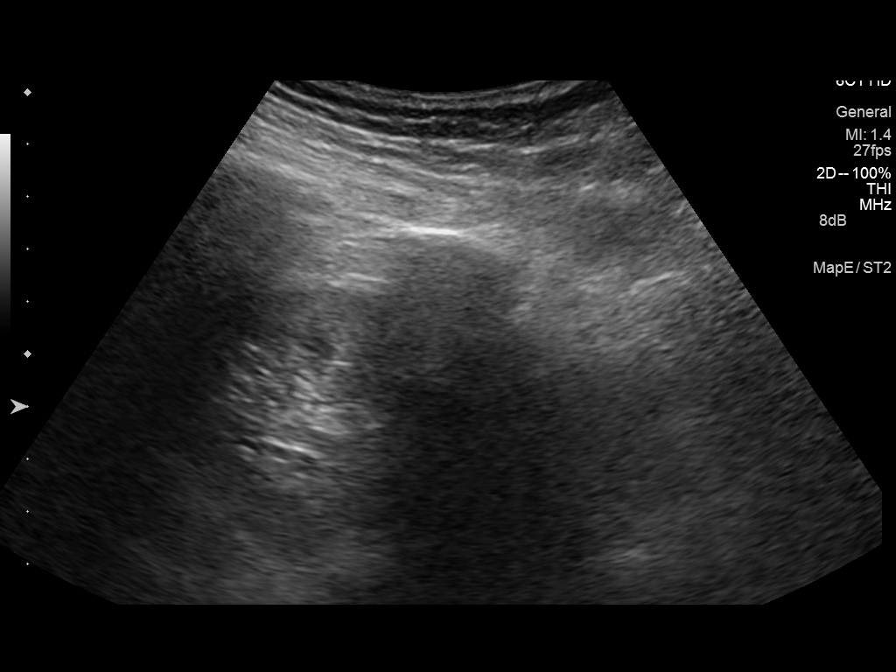
[im 7/8]
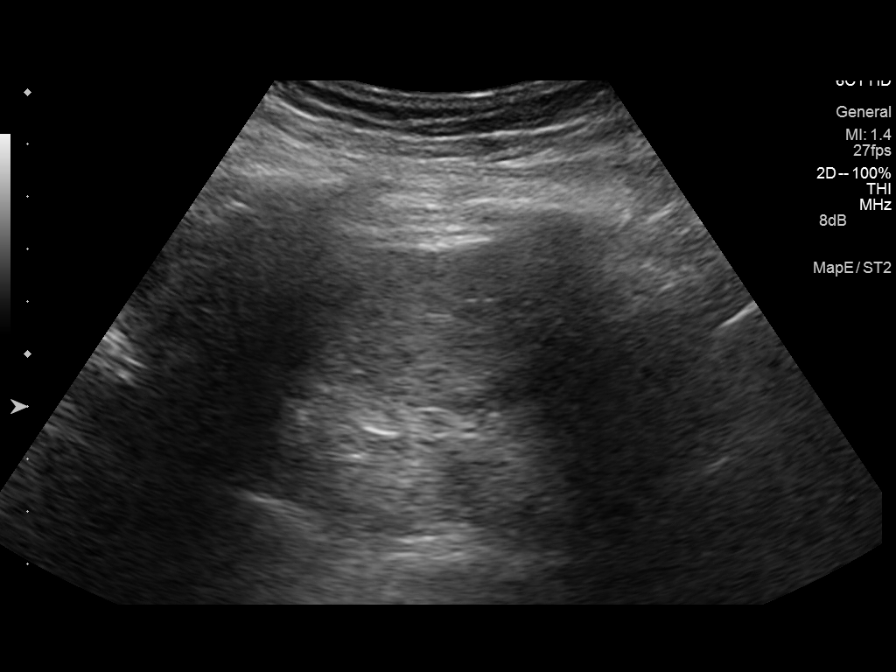
[im 8/8]
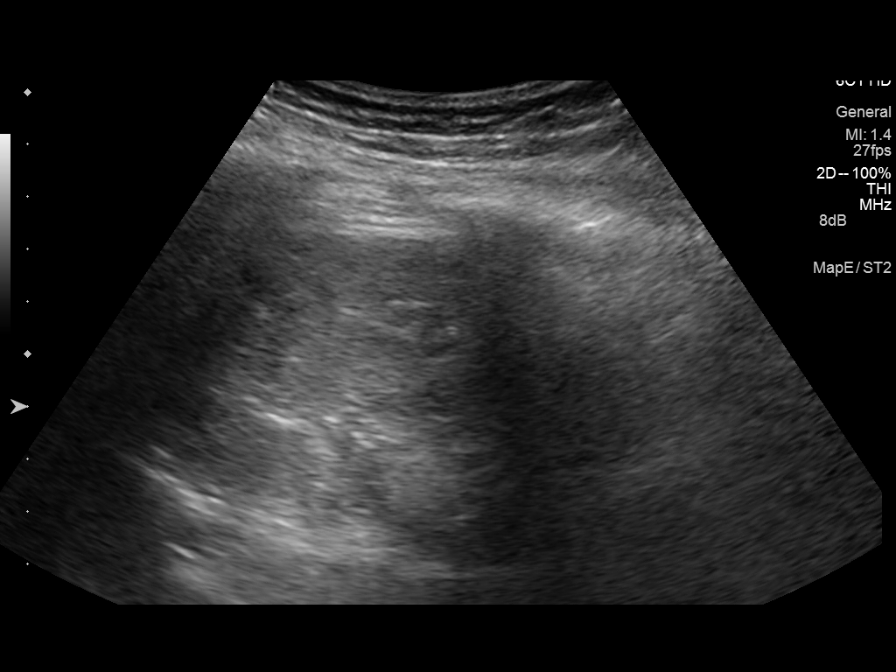

[8 of 8 positions shown; findings below may reference images not displayed]

MEDICATIONS:
Fentanyl 100 mcg IV; Versed 4 mg IV

ANESTHESIA/SEDATION:
Total Moderate Sedation time

13 minutes

COMPLICATIONS:
SIR level A: No therapy, no consequence.

The patient developed a small non enlarging left-sided perinephric
hematoma following the procedure. The patient remained
hemodynamically stable throughout a prolonged observation in the
ultrasound department.

PROCEDURE:
Informed written consent was obtained from the patient after a
discussion of the risks, benefits and alternatives to treatment. The
patient understands and consents the procedure. A timeout was
performed prior to the initiation of the procedure.

Ultrasound scanning was performed of the bilateral flanks. Both
kidneys were poorly visualized with ultrasound secondary to patient
body habitus, increased renal cortical echogenicity and poor
sonographic window.

The inferior pole of the left kidney was selected for biopsy due to
location and sonographic window. The procedure was planned. The
operative site was prepped and draped in the usual sterile fashion.
The overlying soft tissues were anesthetized with 1% lidocaine with
epinephrine. A 17 gauge core needle biopsy device was advanced into
the inferior cortex of the left kidney and 2 core biopsies were
obtained under ultrasound guidance. Real time pathologic review
confirmed adequate tissue acquisition. Images were saved for
documentation purposes. The biopsy device was removed and hemostasis
was obtained with manual compression. Post procedural scanning was
negative for significant post procedural hemorrhage or additional
complication. A dressing was placed. The patient tolerated the
procedure well without immediate post procedural complication,
though developed a small non enlarging perinephric hemorrhage. The
patient remained hemodynamically stable throughout a prolonged
observation in the ultrasound department.
IMPRESSION: Technically successful ultrasound guided left renal biopsy.
Procedure complicated by development of a small non enlarging
left-sided perinephric hematoma.
